# Patient Record
Sex: Female | Born: 2000 | Race: Black or African American | Hispanic: No | Marital: Single | Smoking: Never smoker
Health system: Southern US, Community
[De-identification: ages and names within clinical notes are randomized; demographics above are authoritative.]

---
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
## 2020-02-07 ENCOUNTER — Emergency Department (HOSPITAL_COMMUNITY)
Admission: EM | Admit: 2020-02-07 | Discharge: 2020-02-07 | Disposition: A | Discharge status: Home / Self Care | Payer: 59 | Attending: Emergency Medicine | Admitting: Emergency Medicine

## 2020-02-07 ENCOUNTER — Encounter (HOSPITAL_COMMUNITY): Payer: Self-pay

## 2020-02-07 ENCOUNTER — Other Ambulatory Visit: Payer: Self-pay

## 2020-02-07 DIAGNOSIS — Z711 Person with feared health complaint in whom no diagnosis is made: Secondary | ICD-10-CM | POA: Insufficient documentation

## 2020-02-07 DIAGNOSIS — Z20822 Contact with and (suspected) exposure to covid-19: Secondary | ICD-10-CM | POA: Insufficient documentation

## 2020-02-07 DIAGNOSIS — R509 Fever, unspecified: Secondary | ICD-10-CM | POA: Diagnosis present

## 2020-02-07 DIAGNOSIS — Z79899 Other long term (current) drug therapy: Secondary | ICD-10-CM | POA: Diagnosis not present

## 2020-02-07 LAB — SARS CORONAVIRUS 2 BY RT PCR (HOSPITAL ORDER, PERFORMED IN ~~LOC~~ HOSPITAL LAB): SARS Coronavirus 2: NEGATIVE

## 2020-02-07 NOTE — ED Provider Notes (Signed)
[facility] EMERGENCY DEPARTMENT Provider Note   CSN: [id number] Arrival date & time: 02/07/20  1033     History No chief complaint on file.   Adrienne Johnson is a 19 y.o. female.  The history is provided by the patient. No language interpreter was used.     This is a generally healthy 19 year old female presenting with complaints of fever.  Patient report she went to a job interview this morning.  States she had her temperature checked when entering the building and it was documented her temperature of 101.  Her father decided to have patient come to the ER for evaluation of her fever.  Patient states she is overall feeling well.  She denies feeling fever chills no loss of taste or smell no runny nose sneezing or coughing no congestion, no urinary symptoms and otherwise no other complaints.  She has not been vaccinated for COVID-19.  No specific treatment tried.  She denies any recent sick contact.  History reviewed. No pertinent past medical history.  There are no problems to display for this patient.   History reviewed. No pertinent surgical history.   OB History   No obstetric history on file.     No family history on file.  Social History   Tobacco Use  . Smoking status: Never Smoker  . Smokeless tobacco: Never Used  Substance Use Topics  . Alcohol use: No  . Drug use: Not on file    Home Medications Prior to Admission medications   Medication Sig Start Date End Date Taking? Authorizing Provider  amoxicillin (AMOXIL) 500 MG capsule Take 2 capsules (1,000 mg total) by mouth 2 (two) times daily. 04/30/14   Phillis Haggis, MD  diphenhydrAMINE (BENADRYL) 12.5 MG/5ML liquid Take 25 mg by mouth 4 (four) times daily as needed (rash).     [provider]  EPINEPHrine (EPIPEN JR) 0.15 MG/0.3ML injection Inject 0.15 mg into the muscle as needed. For shellfish allergy Has never had to use    [provider]  famotidine (PEPCID) 20 MG  tablet Take 1 tablet (20 mg total) by mouth 2 (two) times daily. For 2 weeks 01/15/17   Ree Shay, MD  hydrocortisone cream 1 % Apply to affected area 2 times daily 04/30/14   Mabe, Latanya Maudlin, MD  ibuprofen (ADVIL,MOTRIN) 400 MG tablet Take 1 tab PO Q6h x 1-2 days then Q6h prn 01/03/14   Lowanda Foster, NP    Allergies    Shellfish allergy  Review of Systems   Review of Systems  All other systems reviewed and are negative.   Physical Exam Updated Vital Signs BP 117/63 (BP Location: Left Arm)   Pulse 81   Temp 98.8 F (37.1 C) (Oral)   Resp 14   Ht 5\' 7"  (1.702 m)   Wt 53.5 kg   SpO2 97%   BMI 18.48 kg/m   Physical Exam Vitals and nursing note reviewed.  Constitutional:      General: She is not in acute distress.    Appearance: She is well-developed.  HENT:     Head: Atraumatic.  Eyes:     Conjunctiva/sclera: Conjunctivae normal.  Cardiovascular:     Rate and Rhythm: Normal rate and regular rhythm.     Pulses: Normal pulses.     Heart sounds: Normal heart sounds.  Pulmonary:     Effort: Pulmonary effort is normal.     Breath sounds: Normal breath sounds. No wheezing, rhonchi or rales.  Abdominal:  Palpations: Abdomen is soft.     Tenderness: There is no abdominal tenderness.  Musculoskeletal:     Cervical back: Neck supple.  Skin:    Findings: No rash.  Neurological:     Mental Status: She is alert.  Psychiatric:        Mood and Affect: Mood normal.     ED Results / Procedures / Treatments   Labs (all labs ordered are listed, but only abnormal results are displayed) Labs Reviewed - No data to display  EKG None  Radiology No results found.  Procedures Procedures (including critical care time)  Medications Ordered in ED Medications - No data to display  ED Course  I have reviewed the triage vital signs and the nursing notes.  Pertinent labs & imaging results that were available during my care of the patient were reviewed by me and considered in  my medical decision making (see chart for details).    MDM Rules/Calculators/A&P                          BP 117/63 (BP Location: Left Arm)   Pulse 81   Temp 98.8 F (37.1 C) (Oral)   Resp 14   Ht 5\' 7"  (1.702 m)   Wt 53.5 kg   SpO2 97%   BMI 18.48 kg/m   Final Clinical Impression(s) / ED Diagnoses Final diagnoses:  Worried well    Rx / DC Orders ED Discharge Orders    None     1:31 PM Patient here to be evaluated for fever of 101 that was documented at complaints of she went to have a drop in his urine earlier this morning.  She denies having any active symptoms and denies having fever.  Recurrent temperature yes within normal range.  Out of abundance of precaution, a COVID-19 screening test was obtained.  It has not resulted yet.  Patient stable for discharge and can follow-up on the result through MyChart.  Return precaution discussed.  Yamile Roedl was evaluated in Emergency Department on 02/07/2020 for the symptoms described in the history of present illness. She was evaluated in the context of the global COVID-19 pandemic, which necessitated consideration that the patient might be at risk for infection with the SARS-CoV-2 virus that causes COVID-19. Institutional protocols and algorithms that pertain to the evaluation of patients at risk for COVID-19 are in a state of rapid change based on information released by regulatory bodies including the CDC and federal and state organizations. These policies and algorithms were followed during the patient's care in the ED.    02/09/2020, PA-C 02/07/20 1333    Little, 02/09/20, MD 02/07/20 [phone]

## 2020-02-07 NOTE — ED Triage Notes (Signed)
Patient reports that she had fever this am, on arrival no complaints and no fever.

## 2020-02-07 NOTE — Discharge Instructions (Signed)
You here for evaluation of suspected fever.  Temperature is within normal limits at 98.8.  A COVID-19 test have been obtained.  You may be able to check on through MyChart within the next 4 hours.  Return if you have any concern.

## 2020-06-29 ENCOUNTER — Emergency Department (HOSPITAL_COMMUNITY)
Admission: EM | Admit: 2020-06-29 | Discharge: 2020-06-29 | Disposition: A | Discharge status: Home / Self Care | Payer: 59 | Attending: Emergency Medicine | Admitting: Emergency Medicine

## 2020-06-29 ENCOUNTER — Encounter (HOSPITAL_COMMUNITY): Payer: Self-pay | Admitting: Emergency Medicine

## 2020-06-29 ENCOUNTER — Other Ambulatory Visit: Payer: Self-pay

## 2020-06-29 DIAGNOSIS — U071 COVID-19: Secondary | ICD-10-CM | POA: Diagnosis not present

## 2020-06-29 DIAGNOSIS — R062 Wheezing: Secondary | ICD-10-CM

## 2020-06-29 DIAGNOSIS — Z1152 Encounter for screening for COVID-19: Secondary | ICD-10-CM | POA: Diagnosis present

## 2020-06-29 MED ORDER — BENZONATATE 100 MG PO CAPS: 100.0000 mg | ORAL_CAPSULE | Freq: Three times a day (TID) | ORAL | 0 refills | Status: DC

## 2020-06-29 MED ORDER — ALBUTEROL SULFATE HFA 108 (90 BASE) MCG/ACT IN AERS: 2.0000 | INHALATION_SPRAY | Freq: Four times a day (QID) | RESPIRATORY_TRACT | 0 refills | Status: DC | PRN

## 2020-06-29 NOTE — ED Triage Notes (Signed)
Pt arrives to ED with c/o of COVID symptoms. Tested positive at home x4 days ago. States she felt SOB last night and unsure if she can go back to work tomorrow.

## 2020-06-29 NOTE — Discharge Instructions (Signed)
Take medications as prescribed.  Return for new or worsening symptoms. 

## 2020-06-29 NOTE — ED Notes (Signed)
Pt discharged ambulatory. All questions and concerns addressed. No complaints at this time.   

## 2020-06-29 NOTE — ED Provider Notes (Signed)
[facility] EMERGENCY DEPARTMENT Provider Note   CSN: [id number] Arrival date & time: 06/29/20  1428     History Chief Complaint  Patient presents with  . Covid Positive    Adrienne Johnson is a 20 y.o. female with recent past medical history who presents for evaluation of COVID positive status.  Tested positive on Saturday at home test.  She is not vaccinated.  Had sick coworkers.  Patient states she has had cough and wheezing as well as myalgias.  She has taken Tylenol which is helped with her myalgias.  No headache, lightheadedness, dizziness.  Had fever 2 days ago however this resolved.  No history of asthma however she was concerned about her wheeze.  She has no chest pain, lateral leg swelling, redness or warmth.  No hemoptysis chest pain, abdominal pain, diarrhea, dysuria, unilateral leg swelling, redness or warmth.  No prior history of PE, DVT.  No exogenous hormone use, recent surgery immobilization.  Denies additional rating or alleviating factors  History obtained from patient and past medical records.  No interpreter used.  HPI     History reviewed. No pertinent past medical history.  There are no problems to display for this patient.   History reviewed. No pertinent surgical history.   OB History   No obstetric history on file.     History reviewed. No pertinent family history.  Social History   Tobacco Use  . Smoking status: Never Smoker  . Smokeless tobacco: Never Used  Substance Use Topics  . Alcohol use: No    Home Medications Prior to Admission medications   Medication Sig Start Date End Date Taking? Authorizing Provider  albuterol (VENTOLIN HFA) 108 (90 Base) MCG/ACT inhaler Inhale 2 puffs into the lungs every 6 (six) hours as needed for wheezing or shortness of breath. 06/29/20  Yes Rachard Isidro A, PA-C  benzonatate (TESSALON) 100 MG capsule Take 1 capsule (100 mg total) by mouth every 8 (eight) hours. 06/29/20  Yes Rhea Thrun,  Johnnie Goynes A, PA-C  amoxicillin (AMOXIL) 500 MG capsule Take 2 capsules (1,000 mg total) by mouth 2 (two) times daily. 04/30/14   Phillis Haggis, MD  diphenhydrAMINE (BENADRYL) 12.5 MG/5ML liquid Take 25 mg by mouth 4 (four) times daily as needed (rash).     [provider]  EPINEPHrine (EPIPEN JR) 0.15 MG/0.3ML injection Inject 0.15 mg into the muscle as needed. For shellfish allergy Has never had to use    [provider]  famotidine (PEPCID) 20 MG tablet Take 1 tablet (20 mg total) by mouth 2 (two) times daily. For 2 weeks 01/15/17   Ree Shay, MD  hydrocortisone cream 1 % Apply to affected area 2 times daily 04/30/14   Mabe, Latanya Maudlin, MD  ibuprofen (ADVIL,MOTRIN) 400 MG tablet Take 1 tab PO Q6h x 1-2 days then Q6h prn 01/03/14   Lowanda Foster, NP    Allergies    Shellfish allergy  Review of Systems   Review of Systems  Constitutional: Positive for activity change, appetite change, fatigue and fever.  HENT: Positive for congestion and rhinorrhea.   Respiratory: Positive for cough and wheezing.   Cardiovascular: Negative.   Gastrointestinal: Negative.   Genitourinary: Negative.   Musculoskeletal: Positive for myalgias.  Skin: Negative.   Neurological: Negative.   All other systems reviewed and are negative.   Physical Exam Updated Vital Signs BP 110/68 (BP Location: Left Arm)   Pulse 85   Temp 98.8 F (37.1 C) (Oral)   Resp  16   SpO2 99%   Physical Exam Vitals and nursing note reviewed.  Constitutional:      General: She is not in acute distress.    Appearance: She is not ill-appearing, toxic-appearing or diaphoretic.  HENT:     Head: Normocephalic and atraumatic.     Jaw: There is normal jaw occlusion.     Right Ear: Tympanic membrane, ear canal and external ear normal. There is no impacted cerumen. No hemotympanum. Tympanic membrane is not injected, scarred, perforated, erythematous, retracted or bulging.     Left Ear: Tympanic membrane, ear canal and  external ear normal. There is no impacted cerumen. No hemotympanum. Tympanic membrane is not injected, scarred, perforated, erythematous, retracted or bulging.     Ears:     Comments: No Mastoid tenderness.    Nose:     Comments: Clear rhinorrhea and congestion to bilateral nares.  No sinus tenderness.    Mouth/Throat:     Comments: Posterior oropharynx clear.  Mucous membranes moist.  Tonsils without erythema or exudate.  Uvula midline without deviation.  No evidence of PTA or RPA.  No drooling, dysphasia or trismus.  Phonation normal. Neck:     Trachea: Trachea and phonation normal.     Meningeal: Brudzinski's sign and Kernig's sign absent.     Comments: No Neck stiffness or neck rigidity.  No meningismus.  No cervical lymphadenopathy. Cardiovascular:     Comments: No murmurs rubs or gallops. Pulmonary:     Comments: Mild expiratory wheeze.  No accessory muscle usage.  Able speak in full sentences. Abdominal:     Comments: Soft, nontender without rebound or guarding.  No CVA tenderness.  Musculoskeletal:     Comments: Moves all 4 extremities without difficulty.  Lower extremities without edema, erythema or warmth.  Skin:    Comments: Brisk capillary refill.  No rashes or lesions.  Neurological:     Mental Status: She is alert.     Comments: Ambulatory in department without difficulty.  Cranial nerves II through XII grossly intact.  No facial droop.  No aphasia.     ED Results / Procedures / Treatments   Labs (all labs ordered are listed, but only abnormal results are displayed) Labs Reviewed - No data to display  EKG None  Radiology No results found.  Procedures Procedures (including critical care time)  Medications Ordered in ED Medications - No data to display  ED Course  I have reviewed the triage vital signs and the nursing notes.  Pertinent labs & imaging results that were available during my care of the patient were reviewed by me and considered in my medical  decision making (see chart for details).  20 year old, otherwise healthy presents for evaluation of wheeze.  Diagnosed with COVID 4 days PTA.  He is afebrile, nonseptic, not ill-appearing.  She has no respiratory distress.  No tachycardia, tachypnea or hypoxia.  Does have some mild expiratory wheeze.  No unilateral leg swelling, redness or warmth.  Appears otherwise well.  Will DC home with Tessalon Perles and albuterol.  Low suspicion for acute bacterial infection, pneumothorax, PE, dissection, ACS.  She will return for any worsening symptoms  The patient has been appropriately medically screened and/or stabilized in the ED. I have low suspicion for any other emergent medical condition which would require further screening, evaluation or treatment in the ED or require inpatient management.  Patient is hemodynamically stable and in no acute distress.  Patient able to ambulate in department prior to ED.  Evaluation does not show acute pathology that would require ongoing or additional emergent interventions while in the emergency department or further inpatient treatment.  I have discussed the diagnosis with the patient and answered all questions.  Pain is been managed while in the emergency department and patient has no further complaints prior to discharge.  Patient is comfortable with plan discussed in room and is stable for discharge at this time.  I have discussed strict return precautions for returning to the emergency department.  Patient was encouraged to follow-up with PCP/specialist refer to at discharge.    MDM Rules/Calculators/A&P                          Perri Aragones was evaluated in Emergency Department on 06/29/2020 for the symptoms described in the history of present illness. She was evaluated in the context of the global COVID-19 pandemic, which necessitated consideration that the patient might be at risk for infection with the SARS-CoV-2 virus that causes COVID-19. Institutional  protocols and algorithms that pertain to the evaluation of patients at risk for COVID-19 are in a state of rapid change based on information released by regulatory bodies including the CDC and federal and state organizations. These policies and algorithms were followed during the patient's care in the ED. Final Clinical Impression(s) / ED Diagnoses Final diagnoses:  COVID  Wheeze    Rx / DC Orders ED Discharge Orders         Ordered    benzonatate (TESSALON) 100 MG capsule  Every 8 hours        06/29/20 1936    albuterol (VENTOLIN HFA) 108 (90 Base) MCG/ACT inhaler  Every 6 hours PRN        06/29/20 1936           Tyreese Thain A, PA-C 06/29/20 1936    Arby Barrette, MD 07/02/20 [phone]

## 2020-08-30 ENCOUNTER — Encounter (HOSPITAL_COMMUNITY): Payer: Self-pay

## 2020-08-30 ENCOUNTER — Other Ambulatory Visit: Payer: Self-pay

## 2020-08-30 ENCOUNTER — Ambulatory Visit (HOSPITAL_COMMUNITY)
Admission: EM | Admit: 2020-08-30 | Discharge: 2020-08-30 | Disposition: A | Discharge status: Home / Self Care | Payer: 59 | Attending: Medical Oncology | Admitting: Medical Oncology

## 2020-08-30 DIAGNOSIS — R110 Nausea: Secondary | ICD-10-CM | POA: Diagnosis not present

## 2020-08-30 MED ORDER — ONDANSETRON HCL 4 MG PO TABS: 4.0000 mg | ORAL_TABLET | Freq: Four times a day (QID) | ORAL | 0 refills | Status: DC

## 2020-08-30 NOTE — ED Triage Notes (Signed)
Pt in with c/o nausea and loss of appetite that has been going on for 2 days now  Pt also c/o feeling dizzy

## 2020-08-30 NOTE — ED Provider Notes (Signed)
[facility]    CSN: [id number] Arrival date & time: 08/30/20  1111      History   Chief Complaint Chief Complaint  Patient presents with  . Nausea  . loss of appetite    HPI Adrienne Johnson is a 20 y.o. female.   HPI   Nausea: Patient states that for the past 2 days she has had nausea and loss of appetite.  She does state that yesterday morning she woke up and felt a bit dizzy but this has resolved.  She does not believe that head movements cause the dizziness.  She denies any vomiting, diarrhea, constipation.  She has tried eating a brat diet which is helped a bit with symptoms.  No known sick contacts.  No fevers, dysuria, urinary frequency.  Patient reports that she is a virgin and her last menstrual cycle was about 1 month ago.  History reviewed. No pertinent past medical history.  There are no problems to display for this patient.   History reviewed. No pertinent surgical history.  OB History   No obstetric history on file.      Home Medications    Prior to Admission medications   Medication Sig Start Date End Date Taking? Authorizing Provider  albuterol (VENTOLIN HFA) 108 (90 Base) MCG/ACT inhaler Inhale 2 puffs into the lungs every 6 (six) hours as needed for wheezing or shortness of breath. 06/29/20   Henderly, Britni A, PA-C  amoxicillin (AMOXIL) 500 MG capsule Take 2 capsules (1,000 mg total) by mouth 2 (two) times daily. 04/30/14   Mabe, Latanya Maudlin, MD  benzonatate (TESSALON) 100 MG capsule Take 1 capsule (100 mg total) by mouth every 8 (eight) hours. 06/29/20   Henderly, Britni A, PA-C  diphenhydrAMINE (BENADRYL) 12.5 MG/5ML liquid Take 25 mg by mouth 4 (four) times daily as needed (rash).     [provider]  EPINEPHrine (EPIPEN JR) 0.15 MG/0.3ML injection Inject 0.15 mg into the muscle as needed. For shellfish allergy Has never had to use    [provider]  famotidine (PEPCID) 20 MG tablet Take 1 tablet (20 mg total) by mouth 2  (two) times daily. For 2 weeks 01/15/17   Ree Shay, MD  hydrocortisone cream 1 % Apply to affected area 2 times daily 04/30/14   Phillis Haggis, MD  ibuprofen (ADVIL,MOTRIN) 400 MG tablet Take 1 tab PO Q6h x 1-2 days then Q6h prn 01/03/14   Lowanda Foster, NP    Family History History reviewed. No pertinent family history.  Social History Social History   Tobacco Use  . Smoking status: Never Smoker  . Smokeless tobacco: Never Used  Substance Use Topics  . Alcohol use: No     Allergies   Shellfish allergy   Review of Systems Review of Systems  As stated above in HPI Physical Exam Triage Vital Signs ED Triage Vitals  Enc Vitals Group     BP 08/30/20 1149 135/81     Pulse Rate 08/30/20 1149 81     Resp 08/30/20 1149 18     Temp 08/30/20 1149 98.9 F (37.2 C)     Temp src --      SpO2 --      Weight --      Height --      Head Circumference --      Peak Flow --      Pain Score 08/30/20 1148 0     Pain Loc --  Pain Edu? --      Excl. in GC? --    No data found.  Updated Vital Signs BP 135/81   Pulse 81   Temp 98.9 F (37.2 C)   Resp 18   LMP 07/28/2020 (Approximate)    Physical Exam Vitals and nursing note reviewed.  Constitutional:      General: She is not in acute distress.    Appearance: Normal appearance. She is not ill-appearing, toxic-appearing or diaphoretic.  HENT:     Head: Normocephalic.  Eyes:     Extraocular Movements: Extraocular movements intact.     Pupils: Pupils are equal, round, and reactive to light.  Cardiovascular:     Rate and Rhythm: Normal rate and regular rhythm.     Heart sounds: Normal heart sounds.  Pulmonary:     Effort: Pulmonary effort is normal.     Breath sounds: Normal breath sounds.  Abdominal:     General: Abdomen is flat. Bowel sounds are normal. There is no distension.     Palpations: Abdomen is soft. There is no mass.     Tenderness: There is no abdominal tenderness. There is no right CVA tenderness, left  CVA tenderness, guarding or rebound.     Hernia: No hernia is present.  Musculoskeletal:     Cervical back: Normal range of motion.  Lymphadenopathy:     Cervical: No cervical adenopathy.  Skin:    General: Skin is warm.  Neurological:     Mental Status: She is alert.      UC Treatments / Results  Labs (all labs ordered are listed, but only abnormal results are displayed) Labs Reviewed - No data to display  EKG   Radiology No results found.  Procedures Procedures (including critical care time)  Medications Ordered in UC Medications - No data to display  Initial Impression / Assessment and Plan / UC Course  I have reviewed the triage vital signs and the nursing notes.  Pertinent labs & imaging results that were available during my care of the patient were reviewed by me and considered in my medical decision making (see chart for details).     New.  Likely viral gastroenteritis that we are seeing going around.  Treating with Zofran along with a brat diet.  Discussed red flag signs and symptoms.  Follow-up as needed. Final Clinical Impressions(s) / UC Diagnoses   Final diagnoses:  None   Discharge Instructions   None    ED Prescriptions    None     PDMP not reviewed this encounter.   Rushie Chestnut, [place] 08/30/20 1257

## 2021-05-13 ENCOUNTER — Ambulatory Visit (HOSPITAL_COMMUNITY)
Admission: EM | Admit: 2021-05-13 | Discharge: 2021-05-13 | Disposition: A | Discharge status: Home / Self Care | Payer: 59 | Attending: Internal Medicine | Admitting: Internal Medicine

## 2021-05-13 ENCOUNTER — Other Ambulatory Visit: Payer: Self-pay

## 2021-05-13 DIAGNOSIS — R6889 Other general symptoms and signs: Secondary | ICD-10-CM

## 2021-05-13 DIAGNOSIS — R519 Headache, unspecified: Secondary | ICD-10-CM | POA: Diagnosis not present

## 2021-05-13 DIAGNOSIS — J029 Acute pharyngitis, unspecified: Secondary | ICD-10-CM

## 2021-05-13 DIAGNOSIS — R509 Fever, unspecified: Secondary | ICD-10-CM

## 2021-05-13 DIAGNOSIS — R0981 Nasal congestion: Secondary | ICD-10-CM

## 2021-05-13 LAB — POC INFLUENZA A AND B ANTIGEN (URGENT CARE ONLY)
INFLUENZA A ANTIGEN, POC: NEGATIVE
INFLUENZA B ANTIGEN, POC: NEGATIVE

## 2021-05-13 MED ORDER — BENZONATATE 100 MG PO CAPS: 100.0000 mg | ORAL_CAPSULE | Freq: Three times a day (TID) | ORAL | 0 refills | Status: DC

## 2021-05-13 MED ORDER — IBUPROFEN 600 MG PO TABS: 600.0000 mg | ORAL_TABLET | Freq: Four times a day (QID) | ORAL | 0 refills | Status: DC | PRN

## 2021-05-13 NOTE — Discharge Instructions (Signed)
Increase oral fluid intake Take medications as prescribed We will call you with recommendations if labs are abnormal Return to urgent care if symptoms worsen. 

## 2021-05-13 NOTE — ED Triage Notes (Signed)
Patient presents to the office for body aches,fever and coughing x 3 days

## 2021-05-15 ENCOUNTER — Ambulatory Visit (HOSPITAL_COMMUNITY)
Admission: EM | Admit: 2021-05-15 | Discharge: 2021-05-15 | Disposition: A | Discharge status: Home / Self Care | Payer: 59 | Attending: Emergency Medicine | Admitting: Emergency Medicine

## 2021-05-15 ENCOUNTER — Encounter (HOSPITAL_COMMUNITY): Payer: Self-pay | Admitting: Emergency Medicine

## 2021-05-15 ENCOUNTER — Other Ambulatory Visit: Payer: Self-pay

## 2021-05-15 DIAGNOSIS — U071 COVID-19: Secondary | ICD-10-CM | POA: Diagnosis not present

## 2021-05-15 MED ORDER — ALBUTEROL SULFATE HFA 108 (90 BASE) MCG/ACT IN AERS: 2.0000 | INHALATION_SPRAY | Freq: Four times a day (QID) | RESPIRATORY_TRACT | 0 refills | Status: DC | PRN

## 2021-05-15 NOTE — ED Provider Notes (Signed)
[facility]    CSN: [id number] Arrival date & time: 05/15/21  0805      History   Chief Complaint Chief Complaint  Patient presents with   Cough    Chest congestion     HPI Adrienne Johnson is a 20 y.o. female.  Patient reports feeling ill for the last 4 days.  Tested positive for COVID at home this morning.  Symptoms include fever, nasal congestion, sore throat, cough.  Fever has resolved at this point.  Patient reports she is feeling better except last night and this morning she felt a little short of breath.  She does not feel short of breath at this time.  She needs a note for work.  Has been managing symptoms at home with TheraFlu   Cough Associated symptoms: chills, fever, rhinorrhea and sore throat    History reviewed. No pertinent past medical history.  There are no problems to display for this patient.   History reviewed. No pertinent surgical history.  OB History   No obstetric history on file.      Home Medications    Prior to Admission medications   Medication Sig Start Date End Date Taking? Authorizing Provider  albuterol (VENTOLIN HFA) 108 (90 Base) MCG/ACT inhaler Inhale 2 puffs into the lungs every 6 (six) hours as needed for wheezing or shortness of breath. 05/15/21   Cathlyn Parsons, NP  benzonatate (TESSALON) 100 MG capsule Take 1 capsule (100 mg total) by mouth every 8 (eight) hours. 05/13/21   Merrilee Jansky, MD  diphenhydrAMINE (BENADRYL) 12.5 MG/5ML liquid Take 25 mg by mouth 4 (four) times daily as needed (rash).     [provider]  EPINEPHrine (EPIPEN JR) 0.15 MG/0.3ML injection Inject 0.15 mg into the muscle as needed. For shellfish allergy Has never had to use    [provider]  famotidine (PEPCID) 20 MG tablet Take 1 tablet (20 mg total) by mouth 2 (two) times daily. For 2 weeks 01/15/17   Ree Shay, MD  hydrocortisone cream 1 % Apply to affected area 2 times daily 04/30/14   Mabe, Latanya Maudlin, MD  ibuprofen  (ADVIL) 600 MG tablet Take 1 tablet (600 mg total) by mouth every 6 (six) hours as needed. 05/13/21   Lamptey, Britta Mccreedy, MD  ondansetron (ZOFRAN) 4 MG tablet Take 1 tablet (4 mg total) by mouth every 6 (six) hours. 08/30/20   Rushie Chestnut, PA-C    Family History History reviewed. No pertinent family history.  Social History Social History   Tobacco Use   Smoking status: Never   Smokeless tobacco: Never  Substance Use Topics   Alcohol use: No     Allergies   Shellfish allergy   Review of Systems Review of Systems  Constitutional:  Positive for chills and fever.  HENT:  Positive for congestion, postnasal drip, rhinorrhea and sore throat.   Respiratory:  Positive for cough.     Physical Exam Triage Vital Signs ED Triage Vitals  Enc Vitals Group     BP 05/15/21 0826 127/76     Pulse Rate 05/15/21 0826 100     Resp 05/15/21 0826 17     Temp 05/15/21 0826 99 F (37.2 C)     Temp Source 05/15/21 0826 Oral     SpO2 05/15/21 0826 99 %     Weight --      Height --      Head Circumference --      Peak Flow --  Pain Score 05/15/21 0825 0     Pain Loc --      Pain Edu? --      Excl. in [place]? --    No data found.  Updated Vital Signs BP 127/76 (BP Location: Left Arm)   Pulse 100   Temp 99 F (37.2 C) (Oral)   Resp 17   SpO2 99%   Visual Acuity Right Eye Distance:   Left Eye Distance:   Bilateral Distance:    Right Eye Near:   Left Eye Near:    Bilateral Near:     Physical Exam Constitutional:      General: She is not in acute distress.    Appearance: Normal appearance. She is not ill-appearing.  HENT:     Right Ear: Tympanic membrane, ear canal and external ear normal.     Left Ear: Tympanic membrane, ear canal and external ear normal.     Nose: Congestion and rhinorrhea present.     Mouth/Throat:     Mouth: Mucous membranes are moist.     Pharynx: Oropharynx is clear. No oropharyngeal exudate or posterior oropharyngeal erythema.   Cardiovascular:     Rate and Rhythm: Normal rate and regular rhythm.  Pulmonary:     Effort: Pulmonary effort is normal.     Breath sounds: Normal breath sounds.     Comments: No cough observed during H&P Neurological:     Mental Status: She is alert.     UC Treatments / Results  Labs (all labs ordered are listed, but only abnormal results are displayed) Labs Reviewed - No data to display  EKG   Radiology No results found.  Procedures Procedures (including critical care time)  Medications Ordered in UC Medications - No data to display  Initial Impression / Assessment and Plan / UC Course  I have reviewed the triage vital signs and the nursing notes.  Pertinent labs & imaging results that were available during my care of the patient were reviewed by me and considered in my medical decision making (see chart for details).    Reviewed supportive care measures.  Reviewed reasons for seeking a higher level of care.  Reviewed normal course of COVID.  Patient to continue OTC meds to help manage her symptoms.  Reviewed quarantine procedures.  Given note for work.  Patient's lungs clear at this time and her O2 sats are normal.  Given albuterol inhaler prescription to use in case she feels short of breath again.  Final Clinical Impressions(s) / UC Diagnoses   Final diagnoses:  COVID-19     Discharge Instructions      You need to remain home for 5-10 days depending on symptom improvement and work policy; may return if fever free and symptoms consistently improving for 3 days.     ED Prescriptions     Medication Sig Dispense Auth. Provider   albuterol (VENTOLIN HFA) 108 (90 Base) MCG/ACT inhaler Inhale 2 puffs into the lungs every 6 (six) hours as needed for wheezing or shortness of breath. 8 g Cathlyn Parsons, NP      PDMP not reviewed this encounter.   Cathlyn Parsons, NP 05/15/21 [phone]

## 2021-05-15 NOTE — ED Triage Notes (Signed)
Pt is present today with cough and chest congestion. Pt sx started Friday. Pt states she tested positive for Covid at home yesterday

## 2021-05-15 NOTE — Discharge Instructions (Signed)
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

## 2021-05-19 NOTE — ED Provider Notes (Signed)
[facility]    CSN: [id number] Arrival date & time: 05/13/21  1632      History   Chief Complaint Chief Complaint  Patient presents with   Fever   Generalized Body Aches    Coughing x 3 days    HPI Adrienne Johnson is a 20 y.o. female comes to the urgent care with a 3-day history of generalized body aches, fever and general malaise.  Patient's symptoms started 3 days ago and has been persistent.  It is associated with a cough which is nonproductive.  Patient denies any difficulty breathing and breathing.  No nausea, vomiting or diarrhea.  Patient had a fever of 100.9 Fahrenheit on presentation.  No ear pain or hearing problems.  No sick contacts.  No shortness of breath or wheezing.Marland Kitchen   HPI  No past medical history on file.  There are no problems to display for this patient.   No past surgical history on file.  OB History   No obstetric history on file.      Home Medications    Prior to Admission medications   Medication Sig Start Date End Date Taking? Authorizing Provider  benzonatate (TESSALON) 100 MG capsule Take 1 capsule (100 mg total) by mouth every 8 (eight) hours. 05/13/21  Yes Wakisha Alberts, Britta Mccreedy, MD  ibuprofen (ADVIL) 600 MG tablet Take 1 tablet (600 mg total) by mouth every 6 (six) hours as needed. 05/13/21  Yes Analeia Ismael, Britta Mccreedy, MD  albuterol (VENTOLIN HFA) 108 (90 Base) MCG/ACT inhaler Inhale 2 puffs into the lungs every 6 (six) hours as needed for wheezing or shortness of breath. 05/15/21   Cathlyn Parsons, NP  diphenhydrAMINE (BENADRYL) 12.5 MG/5ML liquid Take 25 mg by mouth 4 (four) times daily as needed (rash).     [provider]  EPINEPHrine (EPIPEN JR) 0.15 MG/0.3ML injection Inject 0.15 mg into the muscle as needed. For shellfish allergy Has never had to use    [provider]  famotidine (PEPCID) 20 MG tablet Take 1 tablet (20 mg total) by mouth 2 (two) times daily. For 2 weeks 01/15/17   Ree Shay, MD  hydrocortisone  cream 1 % Apply to affected area 2 times daily 04/30/14   Mabe, Latanya Maudlin, MD  ondansetron (ZOFRAN) 4 MG tablet Take 1 tablet (4 mg total) by mouth every 6 (six) hours. 08/30/20   Rushie Chestnut, PA-C    Family History No family history on file.  Social History Social History   Tobacco Use   Smoking status: Never   Smokeless tobacco: Never  Substance Use Topics   Alcohol use: No     Allergies   Shellfish allergy   Review of Systems Review of Systems  Constitutional:  Positive for chills and fever.  HENT:  Positive for congestion and sore throat.   Respiratory:  Negative for cough, chest tightness, shortness of breath and wheezing.   Cardiovascular:  Negative for chest pain.  Neurological:  Positive for headaches.    Physical Exam Triage Vital Signs ED Triage Vitals  Enc Vitals Group     BP 05/13/21 1713 107/76     Pulse Rate 05/13/21 1713 68     Resp 05/13/21 1713 16     Temp 05/13/21 1713 (!) 100.9 F (38.3 C)     Temp Source 05/13/21 1713 Oral     SpO2 05/13/21 1713 100 %     Weight --      Height --  Head Circumference --      Peak Flow --      Pain Score 05/13/21 1715 6     Pain Loc --      Pain Edu? --      Excl. in GC? --    No data found.  Updated Vital Signs BP 107/76 (BP Location: Left Arm)   Pulse 68   Temp (!) 100.9 F (38.3 C) (Oral)   Resp 16   SpO2 100%   Visual Acuity Right Eye Distance:   Left Eye Distance:   Bilateral Distance:    Right Eye Near:   Left Eye Near:    Bilateral Near:     Physical Exam Vitals and nursing note reviewed.  Constitutional:      Appearance: She is ill-appearing.  HENT:     Right Ear: Tympanic membrane normal.     Left Ear: Tympanic membrane normal.  Cardiovascular:     Rate and Rhythm: Normal rate and regular rhythm.  Pulmonary:     Effort: Pulmonary effort is normal. No respiratory distress.     Breath sounds: No wheezing or rhonchi.  Abdominal:     General: Bowel sounds are normal.      Palpations: Abdomen is soft.  Neurological:     General: No focal deficit present.     Mental Status: She is alert and oriented to person, place, and time.     UC Treatments / Results  Labs (all labs ordered are listed, but only abnormal results are displayed) Labs Reviewed  POC INFLUENZA A AND B ANTIGEN (URGENT CARE ONLY)    EKG   Radiology No results found.  Procedures Procedures (including critical care time)  Medications Ordered in UC Medications - No data to display  Initial Impression / Assessment and Plan / UC Course  I have reviewed the triage vital signs and the nursing notes.  Pertinent labs & imaging results that were available during my care of the patient were reviewed by me and considered in my medical decision making (see chart for details).     1.  Flulike symptoms: Point-of-care flu test is negative Tessalon Perles as needed for cough Ibuprofen as needed for pain or fever Increase oral fluid intake Return precautions given.  Final Clinical Impressions(s) / UC Diagnoses   Final diagnoses:  Flu-like symptoms     Discharge Instructions      Increase oral fluid intake Take medications as prescribed We will call you with recommendations if labs are abnormal Return to urgent care if symptoms worsen.   ED Prescriptions     Medication Sig Dispense Auth. Provider   benzonatate (TESSALON) 100 MG capsule Take 1 capsule (100 mg total) by mouth every 8 (eight) hours. 21 capsule Latonia Conrow, Britta Mccreedy, MD   ibuprofen (ADVIL) 600 MG tablet Take 1 tablet (600 mg total) by mouth every 6 (six) hours as needed. 30 tablet Pina Sirianni, Britta Mccreedy, MD      PDMP not reviewed this encounter.   Merrilee Jansky, MD 05/19/21 [phone]

## 2022-03-23 ENCOUNTER — Encounter (HOSPITAL_COMMUNITY): Payer: Self-pay

## 2022-03-23 ENCOUNTER — Ambulatory Visit (HOSPITAL_COMMUNITY)
Admission: EM | Admit: 2022-03-23 | Discharge: 2022-03-23 | Disposition: A | Discharge status: Home / Self Care | Payer: Self-pay | Attending: Nurse Practitioner | Admitting: Nurse Practitioner

## 2022-03-23 DIAGNOSIS — J358 Other chronic diseases of tonsils and adenoids: Secondary | ICD-10-CM

## 2022-03-23 DIAGNOSIS — Z8709 Personal history of other diseases of the respiratory system: Secondary | ICD-10-CM

## 2022-03-23 DIAGNOSIS — J029 Acute pharyngitis, unspecified: Secondary | ICD-10-CM

## 2022-03-23 LAB — POCT RAPID STREP A, ED / UC: Streptococcus, Group A Screen (Direct): NEGATIVE

## 2022-03-23 LAB — POCT INFECTIOUS MONO SCREEN, ED / UC: Mono Screen: NEGATIVE

## 2022-03-23 MED ORDER — AMOXICILLIN 500 MG PO CAPS: 500.0000 mg | ORAL_CAPSULE | Freq: Two times a day (BID) | ORAL | 0 refills | Status: DC

## 2022-03-23 NOTE — ED Triage Notes (Signed)
Patient states she has had a sore throat that started yesterday. States it hurts when she swallows. Denies fever and any other sx.

## 2022-03-23 NOTE — Discharge Instructions (Addendum)
Rapid strep throat test and mononucleosis screen are both negative today.  I am highly suspicious that you have strep throat.  We have sent a strep throat culture and will call you on Monday if this is positive.    In the meantime, please start the amoxicillin and take it as prescribed to treat strep throat.    Start using salt water gargles, Chloraseptic throat spray, and throat lozenges to help with the throat pain.    Make sure you change her toothbrush after starting treatment.    If symptoms worsen or persist despite treatment, seek medical care.

## 2022-03-23 NOTE — ED Provider Notes (Signed)
[facility]    CSN: [id number] Arrival date & time: 03/23/22  1001      History   Chief Complaint Chief Complaint  Patient presents with   Sore Throat    HPI Adrienne Johnson is a 21 y.o. female.   Presents for 1 day of sore throat.  She denies fever, cough, congestion, chest pain or shortness of breath, postnasal drainage, sneezing, swollen glands, headache, abdominal pain, nausea/vomiting, or diarrhea.  No new rash.  She has been little bit more tired than normal.  Reports her appetite has been decreased and she is not been wanting to eat because it hurts to swallow.  Denies known sick contacts or exposures.  Does report of history of strep throat.  Last documented throat culture was approximately 8 years ago.    History reviewed. No pertinent past medical history.  There are no problems to display for this patient.   History reviewed. No pertinent surgical history.  OB History   No obstetric history on file.      Home Medications    Prior to Admission medications   Medication Sig Start Date End Date Taking? Authorizing Provider  amoxicillin (AMOXIL) 500 MG capsule Take 1 capsule (500 mg total) by mouth 2 (two) times daily for 10 days. 03/23/22 04/02/22 Yes Eulogio Bear, NP  EPINEPHrine (EPIPEN JR) 0.15 MG/0.3ML injection Inject 0.15 mg into the muscle as needed. For shellfish allergy Has never had to use    [provider]    Family History No family history on file.  Social History Social History   Tobacco Use   Smoking status: Never   Smokeless tobacco: Never  Substance Use Topics   Alcohol use: No     Allergies   Shellfish allergy   Review of Systems Review of Systems Per HPI  Physical Exam Triage Vital Signs ED Triage Vitals  Enc Vitals Group     BP 03/23/22 1018 118/82     Pulse Rate 03/23/22 1018 84     Resp 03/23/22 1018 16     Temp 03/23/22 1018 98.7 F (37.1 C)     Temp Source 03/23/22 1018 Oral     SpO2  03/23/22 1018 100 %     Weight --      Height --      Head Circumference --      Peak Flow --      Pain Score 03/23/22 1016 8     Pain Loc --      Pain Edu? --      Excl. in [place]? --    No data found.  Updated Vital Signs BP 118/82 (BP Location: Right Arm)   Pulse 84   Temp 98.7 F (37.1 C) (Oral)   Resp 16   LMP 02/25/2022   SpO2 100%   Visual Acuity Right Eye Distance:   Left Eye Distance:   Bilateral Distance:    Right Eye Near:   Left Eye Near:    Bilateral Near:     Physical Exam Vitals and nursing note reviewed.  Constitutional:      General: She is not in acute distress.    Appearance: She is well-developed. She is not toxic-appearing.  HENT:     Head: Normocephalic and atraumatic.     Right Ear: Tympanic membrane and ear canal normal. No drainage, swelling or tenderness. No middle ear effusion. Tympanic membrane is not erythematous.     Left Ear: Tympanic membrane and ear canal  normal. No drainage, swelling or tenderness.  No middle ear effusion. Tympanic membrane is not erythematous.     Nose: No congestion or rhinorrhea.     Mouth/Throat:     Mouth: Mucous membranes are moist.     Pharynx: Oropharynx is clear. Uvula midline. Posterior oropharyngeal erythema present.     Tonsils: Tonsillar exudate present. 3+ on the right. 3+ on the left.  Eyes:     Extraocular Movements:     Right eye: Normal extraocular motion.     Left eye: Normal extraocular motion.  Cardiovascular:     Rate and Rhythm: Normal rate and regular rhythm.  Pulmonary:     Effort: Pulmonary effort is normal. No respiratory distress.     Breath sounds: Normal breath sounds. No wheezing, rhonchi or rales.  Musculoskeletal:     Cervical back: Normal range of motion and neck supple.  Lymphadenopathy:     Cervical: No cervical adenopathy.  Skin:    General: Skin is warm and dry.     Capillary Refill: Capillary refill takes less than 2 seconds.     Coloration: Skin is not pale.      Findings: No erythema or rash.  Neurological:     Mental Status: She is alert and oriented to person, place, and time.  Psychiatric:        Behavior: Behavior is cooperative.      UC Treatments / Results  Labs (all labs ordered are listed, but only abnormal results are displayed) Labs Reviewed  CULTURE, GROUP A STREP [facility])  POCT RAPID STREP A, ED / UC  POCT INFECTIOUS MONO SCREEN, ED / UC    EKG   Radiology No results found.  Procedures Procedures (including critical care time)  Medications Ordered in UC Medications - No data to display  Initial Impression / Assessment and Plan / UC Course  I have reviewed the triage vital signs and the nursing notes.  Pertinent labs & imaging results that were available during my care of the patient were reviewed by me and considered in my medical decision making (see chart for details).    Patient is well-appearing, normotensive, afebrile, not tachycardic, not tachypneic, oxygenating well on room air.  Rapid strep throat test is negative.  Given tonsillar exudate, erythema, and hypertrophy, mononucleosis screen checked and negative.  Throat culture pending.  Will empirically treat patient with amoxicillin 500 mg twice daily for 10 days.  Symptomatic and supportive care discussed.  ER and return precautions discussed.  The patient was given the opportunity to ask questions.  All questions answered to their satisfaction.  The patient is in agreement to this plan.    Final Clinical Impressions(s) / UC Diagnoses   Final diagnoses:  Acute pharyngitis, unspecified etiology  History of streptococcal sore throat  Tonsillar exudate     Discharge Instructions      Rapid strep throat test and mononucleosis screen are both negative today.  I am highly suspicious that you have strep throat.  We have sent a strep throat culture and will call you on Monday if this is positive.    In the meantime, please start the amoxicillin and take it as  prescribed to treat strep throat.    Start using salt water gargles, Chloraseptic throat spray, and throat lozenges to help with the throat pain.    Make sure you change her toothbrush after starting treatment.    If symptoms worsen or persist despite treatment, seek medical care.  ED Prescriptions     Medication Sig Dispense Auth. Provider   amoxicillin (AMOXIL) 500 MG capsule Take 1 capsule (500 mg total) by mouth 2 (two) times daily for 10 days. 20 capsule Valentino Nose, NP      PDMP not reviewed this encounter.   Valentino Nose, NP 03/23/22 1108

## 2022-03-25 ENCOUNTER — Ambulatory Visit (HOSPITAL_COMMUNITY)
Admission: EM | Admit: 2022-03-25 | Discharge: 2022-03-25 | Disposition: A | Discharge status: Home / Self Care | Payer: Self-pay | Attending: Emergency Medicine | Admitting: Emergency Medicine

## 2022-03-25 ENCOUNTER — Encounter (HOSPITAL_COMMUNITY): Payer: Self-pay | Admitting: Emergency Medicine

## 2022-03-25 DIAGNOSIS — J029 Acute pharyngitis, unspecified: Secondary | ICD-10-CM

## 2022-03-25 LAB — CULTURE, GROUP A STREP (THRC)

## 2022-03-25 NOTE — ED Provider Notes (Signed)
[facility]    CSN: [id number] Arrival date & time: 03/25/22  6387      History   Chief Complaint Chief Complaint  Patient presents with   Sore Throat    HPI Atavia Poppe is a 21 y.o. female.   Patient presents with sore throat, enlarged tonsils and Donyetta Ogletree exudate for 4 days.  Tolerating food and liquids.  Was evaluated in urgent care 2 days ago, rapid strep negative, sent for culture which is negative and monotest negative, was started on amoxicillin prophylactically based on appearance of throat, patient endorses no improvement.  Denies fever, chills, body aches, ear pain, congestion, cough, nausea, vomiting or diarrhea.  Has not attempted additional treatment.  History of reoccurring pharyngitis.  History reviewed. No pertinent past medical history.  There are no problems to display for this patient.   History reviewed. No pertinent surgical history.  OB History   No obstetric history on file.      Home Medications    Prior to Admission medications   Medication Sig Start Date End Date Taking? Authorizing Provider  amoxicillin (AMOXIL) 500 MG capsule Take 1 capsule (500 mg total) by mouth 2 (two) times daily for 10 days. 03/23/22 04/02/22  Valentino Nose, NP  EPINEPHrine (EPIPEN JR) 0.15 MG/0.3ML injection Inject 0.15 mg into the muscle as needed. For shellfish allergy Has never had to use    [provider]    Family History No family history on file.  Social History Social History   Tobacco Use   Smoking status: Never   Smokeless tobacco: Never  Substance Use Topics   Alcohol use: No     Allergies   Shellfish allergy   Review of Systems Review of Systems  Constitutional: Negative.   HENT:  Positive for sore throat. Negative for congestion, dental problem, drooling, ear discharge, ear pain, facial swelling, hearing loss, mouth sores, nosebleeds, postnasal drip, rhinorrhea, sinus pressure, sinus pain, sneezing, tinnitus,  trouble swallowing and voice change.   Respiratory: Negative.    Cardiovascular: Negative.   Gastrointestinal: Negative.   Skin: Negative.   Neurological: Negative.      Physical Exam Triage Vital Signs ED Triage Vitals  Enc Vitals Group     BP 03/25/22 0943 107/70     Pulse Rate 03/25/22 0943 61     Resp 03/25/22 0943 15     Temp 03/25/22 0943 98.9 F (37.2 C)     Temp Source 03/25/22 0943 Oral     SpO2 03/25/22 0943 100 %     Weight --      Height --      Head Circumference --      Peak Flow --      Pain Score 03/25/22 0942 1     Pain Loc --      Pain Edu? --      Excl. in [place]? --    No data found.  Updated Vital Signs BP 107/70 (BP Location: Left Arm)   Pulse 61   Temp 98.9 F (37.2 C) (Oral)   Resp 15   LMP 03/24/2022   SpO2 100%   Visual Acuity Right Eye Distance:   Left Eye Distance:   Bilateral Distance:    Right Eye Near:   Left Eye Near:    Bilateral Near:     Physical Exam Constitutional:      Appearance: She is well-developed.  HENT:     Head: Normocephalic.     Right Ear: Tympanic  membrane and ear canal normal.     Left Ear: Tympanic membrane and ear canal normal.     Nose: No congestion or rhinorrhea.     Mouth/Throat:     Mouth: Mucous membranes are moist.     Pharynx: Posterior oropharyngeal erythema present.     Tonsils: Tonsillar exudate present. 1+ on the right. 1+ on the left.  Cardiovascular:     Rate and Rhythm: Normal rate and regular rhythm.     Heart sounds: Normal heart sounds.  Pulmonary:     Effort: Pulmonary effort is normal.     Breath sounds: Normal breath sounds.  Musculoskeletal:     Cervical back: Normal range of motion and neck supple.  Skin:    General: Skin is warm and dry.  Neurological:     General: No focal deficit present.     Mental Status: She is alert and oriented to person, place, and time.  Psychiatric:        Mood and Affect: Mood normal.        Behavior: Behavior normal.      UC Treatments /  Results  Labs (all labs ordered are listed, but only abnormal results are displayed) Labs Reviewed - No data to display  EKG   Radiology No results found.  Procedures Procedures (including critical care time)  Medications Ordered in UC Medications - No data to display  Initial Impression / Assessment and Plan / UC Course  I have reviewed the triage vital signs and the nursing notes.  Pertinent labs & imaging results that were available during my care of the patient were reviewed by me and considered in my medical decision making (see chart for details).  Viral pharyngitis  Vital signs are stable patient is in no signs of distress nor toxic appearing, mild erythema is noted to the oropharynx with tonsillar adenopathy and exudate, as testing was completed 2 days ago will not repeat, requesting STI panel, pending, will treat per protocol, advised abstinence until lab results, etiology is most likely viral as amoxicillin is not improving symptoms, discussed, recommended supportive measures with follow-up with urgent care as needed Final Clinical Impressions(s) / UC Diagnoses   Final diagnoses:  None   Discharge Instructions   None    ED Prescriptions   None    PDMP not reviewed this encounter.   Hans Eden, NP 03/25/22 1003

## 2022-03-25 NOTE — ED Triage Notes (Signed)
Pt here 2 days ago for sore throat, reports strep was negative. Pt concerned that could be STI since strep was negative. Pain started on Friday. Taking Ibuprofen for pain. Reports still red and swollen. Pt was given amoxicillin for it but not helping.

## 2022-03-25 NOTE — Discharge Instructions (Signed)
Today you are being diagnosed with a sore throat caused by viruses, as you have been taking amoxicillin with no improvement this is most likely because there is no bacteria present and your symptoms are caused by a virus meaning it must resolve with time  Per your request we have tested your throat for gonorrhea, chlamydia and trichomoniasis, testing will return in about 2 to 3 days, you will be notified of positive test results only and treatment is intended at that time, you will need to return to clinic if positive for gonorrhea, please refrain from having any form of sexual intercourse until your lab results have returned  You may continue to take the antibiotic if you would like to do so as this will keep any bacteria from entering your system and worsening your symptoms  You may take Tylenol and ibuprofen every 6 hours as needed for any discomfort  You may gargle salt water, gargle Listerine, eat softer foods, drink warm to cold liquids to preference and attempt use of over-the-counter Chloraseptic spray for additional support  You may follow-up with urgent care as needed

## 2022-03-26 LAB — CYTOLOGY, (ORAL, ANAL, URETHRAL) ANCILLARY ONLY
Chlamydia: NEGATIVE
Comment: NEGATIVE
Comment: NEGATIVE
Comment: NORMAL
Neisseria Gonorrhea: NEGATIVE
Trichomonas: NEGATIVE

## 2022-03-28 ENCOUNTER — Ambulatory Visit (HOSPITAL_COMMUNITY)
Admission: RE | Admit: 2022-03-28 | Discharge: 2022-03-28 | Disposition: A | Discharge status: Home / Self Care | Payer: Self-pay | Source: Ambulatory Visit | Attending: Family Medicine | Admitting: Family Medicine

## 2022-03-28 ENCOUNTER — Encounter (HOSPITAL_COMMUNITY): Payer: Self-pay

## 2022-03-28 ENCOUNTER — Ambulatory Visit: Payer: Self-pay

## 2022-03-28 VITALS — BP 121/72 | HR 72 | Temp 98.8°F | Resp 16

## 2022-03-28 DIAGNOSIS — J039 Acute tonsillitis, unspecified: Secondary | ICD-10-CM

## 2022-03-28 MED ORDER — AMOXICILLIN-POT CLAVULANATE 875-125 MG PO TABS: 1.0000 | ORAL_TABLET | Freq: Two times a day (BID) | ORAL | 0 refills | Status: AC

## 2022-03-28 NOTE — ED Triage Notes (Signed)
Pt was seen here 2-3 days ago for throat pain and swelling. Pt is concern about oral STI.

## 2022-03-28 NOTE — Discharge Instructions (Signed)
You were seen today for sore throat.  I am treating you for tonsillitis today.  Please take augmentin twice/day x 10 days with food to avoid upset stomach.  Please get a new toothbrush in 2 days, and launder your pillowcases and bed sheets.  You can continue salt water gargles and motrin for pain and swelling.  If not improving then you will need to see ENT.  Please call [phone] for an appointment.

## 2022-03-28 NOTE — ED Provider Notes (Signed)
[facility]    CSN: [id number] Arrival date & time: 03/28/22  0920      History   Chief Complaint Chief Complaint  Patient presents with   Oral Swelling    Throat swollen and have white spots in the back of my throat swelling hasn't went down - Entered by patient    HPI Adrienne Johnson is a 21 y.o. female.   She is here for sore throat x 1 week.  Was seen on 10/7, strep and mono were negative.  Given amoxil initially, but then that was stopped with negative culture.  Was seen on 10/9, STI panel done, all negative.  Recommend otc medication only.  She continues to have pain in the back of her throat.  At times is hard to swallow.  She thinks it is still swollen.   No fevers/chills.  No runny nose, congestion or drainage.  She did change her toothbrush and clean bedding.    History reviewed. No pertinent past medical history.  There are no problems to display for this patient.   History reviewed. No pertinent surgical history.  OB History   No obstetric history on file.      Home Medications    Prior to Admission medications   Medication Sig Start Date End Date Taking? Authorizing Provider  amoxicillin (AMOXIL) 500 MG capsule Take 1 capsule (500 mg total) by mouth 2 (two) times daily for 10 days. 03/23/22 04/02/22  Valentino Nose, NP  EPINEPHrine (EPIPEN JR) 0.15 MG/0.3ML injection Inject 0.15 mg into the muscle as needed. For shellfish allergy Has never had to use    [provider]    Family History History reviewed. No pertinent family history.  Social History Social History   Tobacco Use   Smoking status: Never   Smokeless tobacco: Never  Substance Use Topics   Alcohol use: No     Allergies   Shellfish allergy   Review of Systems Review of Systems  Constitutional: Negative.   HENT:  Positive for sore throat, trouble swallowing and voice change. Negative for congestion and rhinorrhea.   Respiratory: Negative.     Gastrointestinal: Negative.   Genitourinary: Negative.   Musculoskeletal: Negative.   Psychiatric/Behavioral: Negative.       Physical Exam Triage Vital Signs ED Triage Vitals [03/28/22 0929]  Enc Vitals Group     BP 121/72     Pulse Rate 72     Resp 16     Temp 98.8 F (37.1 C)     Temp Source Oral     SpO2 98 %     Weight      Height      Head Circumference      Peak Flow      Pain Score      Pain Loc      Pain Edu?      Excl. in [place]?    No data found.  Updated Vital Signs BP 121/72 (BP Location: Left Arm)   Pulse 72   Temp 98.8 F (37.1 C) (Oral)   Resp 16   LMP 03/24/2022   SpO2 98%   Visual Acuity Right Eye Distance:   Left Eye Distance:   Bilateral Distance:    Right Eye Near:   Left Eye Near:    Bilateral Near:     Physical Exam Constitutional:      Appearance: Normal appearance.  HENT:     Head: Normocephalic.     Mouth/Throat:  Mouth: Mucous membranes are moist.     Tonsils: Tonsillar exudate present. 2+ on the right. 2+ on the left.  Cardiovascular:     Rate and Rhythm: Normal rate and regular rhythm.  Pulmonary:     Effort: Pulmonary effort is normal.  Musculoskeletal:     Cervical back: Normal range of motion and neck supple. Tenderness present.  Lymphadenopathy:     Cervical: Cervical adenopathy present.  Skin:    General: Skin is warm.  Neurological:     General: No focal deficit present.     Mental Status: She is alert.  Psychiatric:        Mood and Affect: Mood normal.      UC Treatments / Results  Labs (all labs ordered are listed, but only abnormal results are displayed) Labs Reviewed - No data to display  EKG   Radiology No results found.  Procedures Procedures (including critical care time)  Medications Ordered in UC Medications - No data to display  Initial Impression / Assessment and Plan / UC Course  I have reviewed the triage vital signs and the nursing notes.  Pertinent labs & imaging results  that were available during my care of the patient were reviewed by me and considered in my medical decision making (see chart for details).  Patient was seen today for continued sore throat, tonsillar swelling and exudate.  Her throat is still swollen with exudates today.  All testing thus far has been negative, so will  not repeat today.  I will treat for tonsillitis, and recommend she see ENT if not improving as expected.  She is aware and agrees.   Final Clinical Impressions(s) / UC Diagnoses   Final diagnoses:  Acute tonsillitis, unspecified etiology     Discharge Instructions      You were seen today for sore throat.  I am treating you for tonsillitis today.  Please take augmentin twice/day x 10 days with food to avoid upset stomach.  Please get a new toothbrush in 2 days, and launder your pillowcases and bed sheets.  You can continue salt water gargles and motrin for pain and swelling.  If not improving then you will need to see ENT.  Please call [phone] for an appointment.     ED Prescriptions     Medication Sig Dispense Auth. Provider   amoxicillin-clavulanate (AUGMENTIN) 875-125 MG tablet Take 1 tablet by mouth every 12 (twelve) hours for 10 days. 20 tablet Rondel Oh, MD      PDMP not reviewed this encounter.   Rondel Oh, MD 03/28/22 1000

## 2022-04-18 ENCOUNTER — Ambulatory Visit (HOSPITAL_COMMUNITY)
Admission: EM | Admit: 2022-04-18 | Discharge: 2022-04-18 | Disposition: A | Discharge status: Home / Self Care | Payer: Self-pay | Attending: Internal Medicine | Admitting: Internal Medicine

## 2022-04-18 ENCOUNTER — Encounter (HOSPITAL_COMMUNITY): Payer: Self-pay | Admitting: Emergency Medicine

## 2022-04-18 ENCOUNTER — Other Ambulatory Visit: Payer: Self-pay

## 2022-04-18 DIAGNOSIS — R300 Dysuria: Secondary | ICD-10-CM | POA: Insufficient documentation

## 2022-04-18 DIAGNOSIS — S29012A Strain of muscle and tendon of back wall of thorax, initial encounter: Secondary | ICD-10-CM | POA: Insufficient documentation

## 2022-04-18 LAB — POCT URINALYSIS DIPSTICK, ED / UC
Bilirubin Urine: NEGATIVE
Glucose, UA: NEGATIVE mg/dL
Hgb urine dipstick: NEGATIVE
Ketones, ur: NEGATIVE mg/dL
Leukocytes,Ua: NEGATIVE
Nitrite: NEGATIVE
Protein, ur: 100 mg/dL — AB
Specific Gravity, Urine: 1.025 (ref 1.005–1.030)
Urobilinogen, UA: 0.2 mg/dL (ref 0.0–1.0)
pH: 6 (ref 5.0–8.0)

## 2022-04-18 NOTE — ED Triage Notes (Signed)
Last night had upper back pain and chills.    Last night , patient also noticed slight burning with urination.  Requesting STD testing

## 2022-04-18 NOTE — Discharge Instructions (Addendum)
Increase oral fluid intake We will call you with recommendations if labs are abnormal Your urine exam is negative Please abstain from sexual intercourse until lab results are available Return to urgent care if symptoms worsen.

## 2022-04-18 NOTE — ED Provider Notes (Addendum)
[facility]    CSN: [id number] Arrival date & time: 04/18/22  1008      History   Chief Complaint Chief Complaint  Patient presents with   Back Pain    HPI Adrienne Johnson is a 21 y.o. female comes to the urgent care with mid back pain of 1 day duration.  Patient denies any falls or trauma to the back.  Pain is mild in nature.  Pain is sharp and aggravated by palpating over the area involved.  No known relieving factors.  No heavy lifting.  Patient also complains of slight burning with urination with no frequency of urination or urinary urgency.  She has a new sexual partner and is requesting STD testing.  Patient is engaging in unprotected sexual intercourse.  HPI  History reviewed. No pertinent past medical history.  There are no problems to display for this patient.   History reviewed. No pertinent surgical history.  OB History   No obstetric history on file.      Home Medications    Prior to Admission medications   Medication Sig Start Date End Date Taking? Authorizing Provider  EPINEPHrine (EPIPEN JR) 0.15 MG/0.3ML injection Inject 0.15 mg into the muscle as needed. For shellfish allergy Has never had to use    [provider]    Family History History reviewed. No pertinent family history.  Social History Social History   Tobacco Use   Smoking status: Never   Smokeless tobacco: Never  Vaping Use   Vaping Use: Some days  Substance Use Topics   Alcohol use: Yes   Drug use: Yes    Types: Marijuana     Allergies   Shellfish allergy   Review of Systems Review of Systems As per HPI  Physical Exam Triage Vital Signs ED Triage Vitals  Enc Vitals Group     BP 04/18/22 1108 125/79     Pulse Rate 04/18/22 1108 81     Resp 04/18/22 1108 18     Temp 04/18/22 1108 98.9 F (37.2 C)     Temp Source 04/18/22 1108 Oral     SpO2 04/18/22 1108 98 %     Weight --      Height --      Head Circumference --      Peak Flow --       Pain Score 04/18/22 1106 5     Pain Loc --      Pain Edu? --      Excl. in [place]? --    No data found.  Updated Vital Signs BP 125/79 (BP Location: Left Arm)   Pulse 81   Temp 98.9 F (37.2 C) (Oral)   Resp 18   LMP 03/24/2022   SpO2 98%   Visual Acuity Right Eye Distance:   Left Eye Distance:   Bilateral Distance:    Right Eye Near:   Left Eye Near:    Bilateral Near:     Physical Exam Vitals and nursing note reviewed.  Constitutional:      Appearance: Normal appearance. She is not ill-appearing.  Cardiovascular:     Rate and Rhythm: Normal rate and regular rhythm.     Pulses: Normal pulses.     Heart sounds: Normal heart sounds.  Pulmonary:     Effort: Pulmonary effort is normal.     Breath sounds: Normal breath sounds.  Abdominal:     General: Bowel sounds are normal.     Palpations: Abdomen is soft.  Musculoskeletal:     Comments: Mild tenderness of the left upper back.  No bruising noted.  No rash noted.  Neurological:     Mental Status: She is alert.      UC Treatments / Results  Labs (all labs ordered are listed, but only abnormal results are displayed) Labs Reviewed  POCT URINALYSIS DIPSTICK, ED / UC - Abnormal; Notable for the following components:      Result Value   Protein, ur 100 (*)    All other components within normal limits  CERVICOVAGINAL ANCILLARY ONLY    EKG   Radiology No results found.  Procedures Procedures (including critical care time)  Medications Ordered in UC Medications - No data to display  Initial Impression / Assessment and Plan / UC Course  I have reviewed the triage vital signs and the nursing notes.  Pertinent labs & imaging results that were available during my care of the patient were reviewed by me and considered in my medical decision making (see chart for details).     1.  Dysuria: Point-of-care urinalysis is negative for urinary tract infection No indication for urine cultures Patient is advised to  increase oral fluid intake Cervicovaginal swab for STI evaluation.  2.  Muscle strain over upper back: Over-the-counter ibuprofen and Tylenol Gentle stretching exercises Heating pad use only 20 minutes on-20 minutes of cycle Return precautions given. Final Clinical Impressions(s) / UC Diagnoses   Final diagnoses:  Dysuria  Muscle strain of left upper back, initial encounter     Discharge Instructions      Increase oral fluid intake We will call you with recommendations if labs are abnormal Your urine exam is negative Please abstain from sexual intercourse until lab results are available Return to urgent care if symptoms worsen.   ED Prescriptions   None    PDMP not reviewed this encounter.   Merrilee Jansky, MD 04/18/22 2251    Merrilee Jansky, MD 04/18/22 2251

## 2022-04-22 LAB — CERVICOVAGINAL ANCILLARY ONLY
Chlamydia: NEGATIVE
Comment: NEGATIVE
Comment: NEGATIVE
Comment: NORMAL
Neisseria Gonorrhea: NEGATIVE
Trichomonas: NEGATIVE

## 2022-05-26 ENCOUNTER — Ambulatory Visit (HOSPITAL_COMMUNITY)
Admission: EM | Admit: 2022-05-26 | Discharge: 2022-05-26 | Disposition: A | Discharge status: Home / Self Care | Payer: Commercial Managed Care - HMO | Attending: Family Medicine | Admitting: Family Medicine

## 2022-05-26 ENCOUNTER — Encounter (HOSPITAL_COMMUNITY): Payer: Self-pay | Admitting: *Deleted

## 2022-05-26 ENCOUNTER — Other Ambulatory Visit: Payer: Self-pay

## 2022-05-26 DIAGNOSIS — J351 Hypertrophy of tonsils: Secondary | ICD-10-CM | POA: Diagnosis not present

## 2022-05-26 NOTE — Discharge Instructions (Signed)
You were seen today for enlarged tonsils.   You should make an appointment with an ENT for further discussion about this.  You may call [facility] at [phone] to make an appointment.

## 2022-05-26 NOTE — ED Provider Notes (Signed)
[facility]    CSN: [id number] Arrival date & time: 05/26/22  1615      History   Chief Complaint Chief Complaint  Patient presents with   Sore Throat    itis    HPI Adrienne Johnson is a 21 y.o. female.   She is still for continued swelling of tonsils.  She was seen several months ago for recurrent sore throat and swollen tonsils.  Place on abx for this multiple times.  She admits to not taking the medication as directed.  She is here b/c of continued swelling, but ot painful.  No runny nose, congestion, drainage.  No fevers, chills or headaches. She does snore at night, which is new.        History reviewed. No pertinent past medical history.  There are no problems to display for this patient.   History reviewed. No pertinent surgical history.  OB History   No obstetric history on file.      Home Medications    Prior to Admission medications   Medication Sig Start Date End Date Taking? Authorizing Provider  EPINEPHrine (EPIPEN JR) 0.15 MG/0.3ML injection Inject 0.15 mg into the muscle as needed. For shellfish allergy Has never had to use    [provider]    Family History History reviewed. No pertinent family history.  Social History Social History   Tobacco Use   Smoking status: Never   Smokeless tobacco: Never  Vaping Use   Vaping Use: Some days  Substance Use Topics   Alcohol use: Yes   Drug use: Yes    Types: Marijuana     Allergies   Shellfish allergy   Review of Systems Review of Systems  Constitutional: Negative.   HENT: Negative.    Respiratory: Negative.    Cardiovascular: Negative.   Gastrointestinal: Negative.   Musculoskeletal: Negative.   Psychiatric/Behavioral: Negative.       Physical Exam Triage Vital Signs ED Triage Vitals  Enc Vitals Group     BP 05/26/22 1724 99/63     Pulse Rate 05/26/22 1724 76     Resp 05/26/22 1724 18     Temp 05/26/22 1724 98.3 F (36.8 C)     Temp src --       SpO2 05/26/22 1724 99 %     Weight --      Height --      Head Circumference --      Peak Flow --      Pain Score 05/26/22 1722 0     Pain Loc --      Pain Edu? --      Excl. in [place]? --    No data found.  Updated Vital Signs BP 99/63   Pulse 76   Temp 98.3 F (36.8 C)   Resp 18   LMP 04/29/2022   SpO2 99%   Visual Acuity Right Eye Distance:   Left Eye Distance:   Bilateral Distance:    Right Eye Near:   Left Eye Near:    Bilateral Near:     Physical Exam Constitutional:      Appearance: She is well-developed.  HENT:     Head: Normocephalic and atraumatic.     Nose: No congestion or rhinorrhea.     Mouth/Throat:     Pharynx: No pharyngeal swelling, oropharyngeal exudate, posterior oropharyngeal erythema or uvula swelling.     Tonsils: No tonsillar exudate. 3+ on the right. 3+ on the left.  Cardiovascular:  Rate and Rhythm: Normal rate and regular rhythm.     Heart sounds: Normal heart sounds.  Pulmonary:     Effort: Pulmonary effort is normal.  Musculoskeletal:     Cervical back: Normal range of motion and neck supple.  Lymphadenopathy:     Cervical: No cervical adenopathy.  Neurological:     General: No focal deficit present.     Mental Status: She is alert.  Psychiatric:        Mood and Affect: Mood normal.      UC Treatments / Results  Labs (all labs ordered are listed, but only abnormal results are displayed) Labs Reviewed - No data to display  EKG   Radiology No results found.  Procedures Procedures (including critical care time)  Medications Ordered in UC Medications - No data to display  Initial Impression / Assessment and Plan / UC Course  I have reviewed the triage vital signs and the nursing notes.  Pertinent labs & imaging results that were available during my care of the patient were reviewed by me and considered in my medical decision making (see chart for details).   Final Clinical Impressions(s) / UC Diagnoses   Final  diagnoses:  Enlarged tonsils     Discharge Instructions      You were seen today for enlarged tonsils.   You should make an appointment with an ENT for further discussion about this.  You may call [facility] at [phone] to make an appointment.     ED Prescriptions   None    PDMP not reviewed this encounter.   Jannifer Franklin, MD 05/26/22 [phone]

## 2022-05-26 NOTE — ED Triage Notes (Signed)
Pt reports she did not take the anti-bx as directed and still has Sx's of tonsill.

## 2022-05-31 ENCOUNTER — Ambulatory Visit (HOSPITAL_COMMUNITY)
Admission: EM | Admit: 2022-05-31 | Discharge: 2022-05-31 | Disposition: A | Discharge status: Home / Self Care | Payer: Commercial Managed Care - HMO | Attending: Emergency Medicine | Admitting: Emergency Medicine

## 2022-05-31 ENCOUNTER — Encounter (HOSPITAL_COMMUNITY): Payer: Self-pay | Admitting: Emergency Medicine

## 2022-05-31 DIAGNOSIS — Z3202 Encounter for pregnancy test, result negative: Secondary | ICD-10-CM

## 2022-05-31 LAB — POC URINE PREG, ED: Preg Test, Ur: NEGATIVE

## 2022-05-31 NOTE — ED Provider Notes (Signed)
[facility]    CSN: [id number] Arrival date & time: 05/31/22  1018      History   Chief Complaint Chief Complaint  Patient presents with   Possible Pregnancy    HPI Adrienne Johnson is a 21 y.o. female.  Presents for pregnancy test Menstrual cycle is 6 days late Recent unprotected intercourse  Developed nausea yesterday No vomiting  Reports cycles are usually pretty regular but can sometimes be off a couple days She does report increased stress lately with "a lot going on"  History reviewed. No pertinent past medical history.  There are no problems to display for this patient.   History reviewed. No pertinent surgical history.  OB History   No obstetric history on file.      Home Medications    Prior to Admission medications   Medication Sig Start Date End Date Taking? Authorizing Provider  EPINEPHrine (EPIPEN JR) 0.15 MG/0.3ML injection Inject 0.15 mg into the muscle as needed. For shellfish allergy Has never had to use    [provider]    Family History History reviewed. No pertinent family history.  Social History Social History   Tobacco Use   Smoking status: Never   Smokeless tobacco: Never  Vaping Use   Vaping Use: Some days  Substance Use Topics   Alcohol use: Yes   Drug use: Yes    Types: Marijuana     Allergies   Shellfish allergy   Review of Systems Review of Systems As per HPI  Physical Exam Triage Vital Signs ED Triage Vitals  Enc Vitals Group     BP 05/31/22 1057 129/81     Pulse Rate 05/31/22 1057 68     Resp 05/31/22 1057 17     Temp 05/31/22 1057 98.3 F (36.8 C)     Temp Source 05/31/22 1057 Oral     SpO2 05/31/22 1057 97 %     Weight --      Height --      Head Circumference --      Peak Flow --      Pain Score 05/31/22 1056 0     Pain Loc --      Pain Edu? --      Excl. in [place]? --    No data found.  Updated Vital Signs BP 129/81 (BP Location: Right Arm)   Pulse 68   Temp 98.3 F  (36.8 C) (Oral)   Resp 17   LMP 04/29/2022   SpO2 97%   Physical Exam Vitals and nursing note reviewed.  Constitutional:      General: She is not in acute distress.    Appearance: Normal appearance.  HENT:     Mouth/Throat:     Pharynx: Oropharynx is clear.  Cardiovascular:     Rate and Rhythm: Normal rate and regular rhythm.     Pulses: Normal pulses.  Pulmonary:     Effort: Pulmonary effort is normal.  Neurological:     Mental Status: She is alert and oriented to person, place, and time.     UC Treatments / Results  Labs (all labs ordered are listed, but only abnormal results are displayed) Labs Reviewed  POC URINE PREG, ED    EKG  Radiology No results found.  Procedures Procedures  Medications Ordered in UC Medications - No data to display  Initial Impression / Assessment and Plan / UC Course  I have reviewed the triage vital signs and the nursing notes.  Pertinent  labs & imaging results that were available during my care of the patient were reviewed by me and considered in my medical decision making (see chart for details).  UPT negative Discussed possible reasons for delayed cycle  Recommend to monitor for cycle onset. Can return if needed or use OTC pregnancy test. Understands may take 10 days for positive test after unprotected intercourse. Discussed safe sex practices. Provided ob/gyn clinic to establish with if needed.  Final Clinical Impressions(s) / UC Diagnoses   Final diagnoses:  Pregnancy examination or test, negative result     Discharge Instructions      Monitor for onset of cycle. You are welcome to return as needed. Sometimes test will not be positive until 10 days after encounter.      ED Prescriptions   None    PDMP not reviewed this encounter.   [place], [place] 05/31/22 1135

## 2022-05-31 NOTE — Discharge Instructions (Addendum)
Monitor for onset of cycle. You are welcome to return as needed. Sometimes test will not be positive until 10 days after encounter.

## 2022-05-31 NOTE — ED Triage Notes (Signed)
Pt reports her menstrual cycle is 6 days late. States she has been nauseous since yesterday.

## 2022-07-08 ENCOUNTER — Ambulatory Visit (HOSPITAL_COMMUNITY)
Admission: EM | Admit: 2022-07-08 | Discharge: 2022-07-08 | Disposition: A | Discharge status: Home / Self Care | Payer: Commercial Managed Care - HMO | Attending: Internal Medicine | Admitting: Internal Medicine

## 2022-07-08 ENCOUNTER — Encounter (HOSPITAL_COMMUNITY): Payer: Self-pay | Admitting: *Deleted

## 2022-07-08 ENCOUNTER — Other Ambulatory Visit: Payer: Self-pay

## 2022-07-08 DIAGNOSIS — J101 Influenza due to other identified influenza virus with other respiratory manifestations: Secondary | ICD-10-CM | POA: Diagnosis not present

## 2022-07-08 LAB — POC INFLUENZA A AND B ANTIGEN (URGENT CARE ONLY)
INFLUENZA A ANTIGEN, POC: NEGATIVE
INFLUENZA B ANTIGEN, POC: POSITIVE — AB

## 2022-07-08 MED ORDER — OSELTAMIVIR PHOSPHATE 75 MG PO CAPS: 75.0000 mg | ORAL_CAPSULE | Freq: Two times a day (BID) | ORAL | 0 refills | Status: DC

## 2022-07-08 MED ORDER — IBUPROFEN 600 MG PO TABS: 600.0000 mg | ORAL_TABLET | Freq: Four times a day (QID) | ORAL | 0 refills | Status: DC | PRN

## 2022-07-08 NOTE — ED Provider Notes (Signed)
[place]    CSN: [id number] Arrival date & time: 07/08/22  1456      History   Chief Complaint Chief Complaint  Patient presents with   Headache   Chills   Generalized Body Aches   Cough    HPI Adrienne Johnson is a 22 y.o. female comes to the urgent care with fever, headache, generalized body aches, chills of 2 days duration.  Patient's symptoms started abruptly and has been persistent.  She denies any nausea or vomiting.  No diarrhea.  No shortness of breath or wheezing.  Patient has a cough which is not productive of sputum.  She denies any chest tightness.  No sick contacts.  No abdominal pain or distention.  No rash noted.   HPI  History reviewed. No pertinent past medical history.  There are no problems to display for this patient.   History reviewed. No pertinent surgical history.  OB History   No obstetric history on file.      Home Medications    Prior to Admission medications   Medication Sig Start Date End Date Taking? Authorizing Provider  ibuprofen (ADVIL) 600 MG tablet Take 1 tablet (600 mg total) by mouth every 6 (six) hours as needed. 07/08/22  Yes Shan Valdes, Myrene Galas, MD  oseltamivir (TAMIFLU) 75 MG capsule Take 1 capsule (75 mg total) by mouth every 12 (twelve) hours. 07/08/22  Yes Jimma Ortman, Myrene Galas, MD  EPINEPHrine (EPIPEN JR) 0.15 MG/0.3ML injection Inject 0.15 mg into the muscle as needed. For shellfish allergy Has never had to use    [provider]    Family History History reviewed. No pertinent family history.  Social History Social History   Tobacco Use   Smoking status: Never   Smokeless tobacco: Never  Vaping Use   Vaping Use: Some days  Substance Use Topics   Alcohol use: Yes   Drug use: Yes    Types: Marijuana     Allergies   Shellfish allergy   Review of Systems Review of Systems  Constitutional:  Positive for activity change, chills and fever. Negative for fatigue.  HENT:  Positive for congestion  and sore throat.   Respiratory:  Positive for cough. Negative for chest tightness and wheezing.   Gastrointestinal: Negative.   Genitourinary: Negative.   Musculoskeletal:  Positive for myalgias.  Neurological:  Positive for headaches.     Physical Exam Triage Vital Signs ED Triage Vitals  Enc Vitals Group     BP 07/08/22 1648 128/84     Pulse Rate 07/08/22 1648 99     Resp 07/08/22 1648 18     Temp 07/08/22 1648 (!) 100.7 F (38.2 C)     Temp src --      SpO2 07/08/22 1648 99 %     Weight --      Height --      Head Circumference --      Peak Flow --      Pain Score 07/08/22 1647 7     Pain Loc --      Pain Edu? --      Excl. in [place]? --    No data found.  Updated Vital Signs BP 128/84   Pulse 99   Temp (!) 100.7 F (38.2 C)   Resp 18   LMP 07/08/2022   SpO2 99%   Visual Acuity Right Eye Distance:   Left Eye Distance:   Bilateral Distance:    Right Eye Near:   Left  Eye Near:    Bilateral Near:     Physical Exam Vitals and nursing note reviewed.  Constitutional:      Appearance: She is well-developed. She is ill-appearing.  HENT:     Mouth/Throat:     Mouth: Mucous membranes are moist.     Comments: Mild pharyngeal erythema Cardiovascular:     Rate and Rhythm: Normal rate and regular rhythm.  Pulmonary:     Effort: Pulmonary effort is normal.     Breath sounds: Normal breath sounds.  Abdominal:     General: Bowel sounds are normal.     Palpations: Abdomen is soft.  Musculoskeletal:     Cervical back: Normal range of motion and neck supple.  Neurological:     Mental Status: She is alert.     GCS: GCS eye subscore is 4. GCS verbal subscore is 5. GCS motor subscore is 6.      UC Treatments / Results  Labs (all labs ordered are listed, but only abnormal results are displayed) Labs Reviewed  POC INFLUENZA A AND B ANTIGEN (URGENT CARE ONLY) - Abnormal; Notable for the following components:      Result Value   INFLUENZA B ANTIGEN, POC POSITIVE  (*)    All other components within normal limits    EKG   Radiology No results found.  Procedures Procedures (including critical care time)  Medications Ordered in UC Medications - No data to display  Initial Impression / Assessment and Plan / UC Course  I have reviewed the triage vital signs and the nursing notes.  Pertinent labs & imaging results that were available during my care of the patient were reviewed by me and considered in my medical decision making (see chart for details).     1.  Influenza B infection: Tamiflu 75 mg twice daily for 5 days Ibuprofen as needed for pain and/or fever Increase oral fluid intake If you have worsening symptoms please return to urgent care to be reevaluated. Final Clinical Impressions(s) / UC Diagnoses   Final diagnoses:  Influenza B     Discharge Instructions      Please increase oral fluid intake Take medications as prescribed Tylenol or ibuprofen as needed for pain and/or fever Your flu test is positive for influenza B If you have worsening symptoms please return to urgent care to be reevaluated.     ED Prescriptions     Medication Sig Dispense Auth. Provider   oseltamivir (TAMIFLU) 75 MG capsule Take 1 capsule (75 mg total) by mouth every 12 (twelve) hours. 10 capsule Jeylin Woodmansee, Myrene Galas, MD   ibuprofen (ADVIL) 600 MG tablet Take 1 tablet (600 mg total) by mouth every 6 (six) hours as needed. 30 tablet Teruo Stilley, Myrene Galas, MD      PDMP not reviewed this encounter.   Chase Picket, MD 07/08/22 [phone]

## 2022-07-08 NOTE — Discharge Instructions (Addendum)
Please increase oral fluid intake Take medications as prescribed Tylenol or ibuprofen as needed for pain and/or fever Your flu test is positive for influenza B If you have worsening symptoms please return to urgent care to be reevaluated.

## 2022-07-08 NOTE — ED Triage Notes (Signed)
Pt reports for 2 days having a cough,chills,body aches and a HA.

## 2022-07-19 ENCOUNTER — Other Ambulatory Visit: Payer: Self-pay

## 2022-07-19 ENCOUNTER — Encounter (HOSPITAL_BASED_OUTPATIENT_CLINIC_OR_DEPARTMENT_OTHER): Payer: Self-pay

## 2022-07-19 DIAGNOSIS — Z20822 Contact with and (suspected) exposure to covid-19: Secondary | ICD-10-CM | POA: Diagnosis not present

## 2022-07-19 DIAGNOSIS — J020 Streptococcal pharyngitis: Secondary | ICD-10-CM | POA: Diagnosis not present

## 2022-07-19 DIAGNOSIS — J029 Acute pharyngitis, unspecified: Secondary | ICD-10-CM | POA: Diagnosis present

## 2022-07-19 NOTE — ED Triage Notes (Signed)
Pt reports sore throat and fever onset yesterday but worsened today. She reports her tonsils are swollen and she feels like she has a lot of mucus in her throat.

## 2022-07-20 ENCOUNTER — Emergency Department (HOSPITAL_BASED_OUTPATIENT_CLINIC_OR_DEPARTMENT_OTHER)
Admission: EM | Admit: 2022-07-20 | Discharge: 2022-07-20 | Disposition: A | Discharge status: Home / Self Care | Payer: Commercial Managed Care - HMO | Attending: Emergency Medicine | Admitting: Emergency Medicine

## 2022-07-20 DIAGNOSIS — J020 Streptococcal pharyngitis: Secondary | ICD-10-CM

## 2022-07-20 LAB — RESP PANEL BY RT-PCR (RSV, FLU A&B, COVID)  RVPGX2
Influenza A by PCR: NEGATIVE
Influenza B by PCR: NEGATIVE
Resp Syncytial Virus by PCR: NEGATIVE
SARS Coronavirus 2 by RT PCR: NEGATIVE

## 2022-07-20 LAB — GROUP A STREP BY PCR: Group A Strep by PCR: DETECTED — AB

## 2022-07-20 MED ORDER — SODIUM CHLORIDE 0.9 % IV BOLUS
1000.0000 mL | Freq: Once | INTRAVENOUS | Status: AC
  Administered 2022-07-20: 1000 mL via INTRAVENOUS

## 2022-07-20 MED ORDER — SODIUM CHLORIDE 0.9 % IV SOLN
3.0000 g | Freq: Once | INTRAVENOUS | Status: AC
  Administered 2022-07-20: 3 g via INTRAVENOUS

## 2022-07-20 MED ORDER — AMOXICILLIN 250 MG/5ML PO SUSR: 500.0000 mg | Freq: Two times a day (BID) | ORAL | 0 refills | Status: DC

## 2022-07-20 MED ORDER — ONDANSETRON HCL 4 MG/2ML IJ SOLN
4.0000 mg | Freq: Once | INTRAMUSCULAR | Status: AC
  Administered 2022-07-20: 4 mg via INTRAVENOUS
  Filled 2022-07-20: qty 2

## 2022-07-20 MED ORDER — DEXAMETHASONE SODIUM PHOSPHATE 10 MG/ML IJ SOLN
10.0000 mg | Freq: Once | INTRAMUSCULAR | Status: AC
  Administered 2022-07-20: 10 mg via INTRAVENOUS
  Filled 2022-07-20: qty 1

## 2022-07-20 NOTE — ED Provider Notes (Signed)
[place] [facility] Provider Note   CSN: [id number] Arrival date & time: 07/19/22  2318     History  Chief Complaint  Patient presents with   Sore Throat    Adrienne Johnson is a 22 y.o. female.  The history is provided by the patient.  Sore Throat This is a new problem. The current episode started 12 to 24 hours ago. The problem occurs constantly. The problem has been gradually worsening. The symptoms are aggravated by swallowing. Nothing relieves the symptoms.   Patient reports sore throat over the past day.  She reports increased mucus.  She is able to swallow but it hurts.    Home Medications Prior to Admission medications   Medication Sig Start Date End Date Taking? Authorizing Provider  amoxicillin (AMOXIL) 250 MG/5ML suspension Take 10 mLs (500 mg total) by mouth 2 (two) times daily. 07/20/22  Yes Ripley Fraise, MD  EPINEPHrine [facility] JR) 0.15 MG/0.3ML injection Inject 0.15 mg into the muscle as needed. For shellfish allergy Has never had to use    [provider]  ibuprofen (ADVIL) 600 MG tablet Take 1 tablet (600 mg total) by mouth every 6 (six) hours as needed. 07/08/22   LampteyMyrene Galas, MD  oseltamivir (TAMIFLU) 75 MG capsule Take 1 capsule (75 mg total) by mouth every 12 (twelve) hours. 07/08/22   Chase Picket, MD      Allergies    Shellfish allergy    Review of Systems   Review of Systems  Physical Exam Updated Vital Signs BP 97/63 (BP Location: Left Arm)   Pulse 93   Temp 99.5 F (37.5 C) (Oral)   Resp 18   Ht 1.702 m (5\' 7" )   Wt 54.4 kg   LMP 07/08/2022   SpO2 100%   BMI 18.79 kg/m  Physical Exam CONSTITUTIONAL: Well developed/well nourished HEAD: Normocephalic/atraumatic EYES: EOMI/PERRL ENMT: Mucous membranes moist, uvula midline but tonsils are enlarged and symmetric.  Erythema is noted.  No drooling, no stridor. NECK: supple no meningeal signs, mild left cervical lymphadenopathy NEURO:  Pt is awake/alert/appropriate, moves all extremitiesx4.  No facial droop.   EXTREMITIES: full ROM SKIN: warm, color normal PSYCH: Mildly anxious ED Results / Procedures / Treatments   Labs (all labs ordered are listed, but only abnormal results are displayed) Labs Reviewed  GROUP A STREP BY PCR - Abnormal; Notable for the following components:      Result Value   Group A Strep by PCR DETECTED (*)    All other components within normal limits  RESP PANEL BY RT-PCR (RSV, FLU A&B, COVID)  RVPGX2    EKG None  Radiology No results found.  Procedures Procedures    Medications Ordered in ED Medications  dexamethasone (DECADRON) injection 10 mg (10 mg Intravenous Given 07/20/22 0332)  sodium chloride 0.9 % bolus 1,000 mL ( Intravenous Stopped 07/20/22 0434)  ondansetron (ZOFRAN) injection 4 mg (4 mg Intravenous Given 07/20/22 0331)  Ampicillin-Sulbactam (UNASYN) 3 g in sodium chloride 0.9 % 100 mL IVPB (0 g Intravenous Stopped 07/20/22 0410)    ED Course/ Medical Decision Making/ A&P Clinical Course as of 07/20/22 0457  Sat Jul 20, 2022  1324 Patient presents with sore throat found to have strep pharyngitis.  Her tonsils are symmetrically enlarged bilaterally and she appears uncomfortable.  Will treat with fluids, Decadron and antibiotics.  No signs of peritonsillar abscess.  I anticipate discharge after medications [DW]  0457 Patient improved, no drooling, no  stridor.  She will be discharged home on amoxicillin.  We discussed strict return precautions [DW]    Clinical Course User Index [DW] Ripley Fraise, MD                             Medical Decision Making Risk Prescription drug management.           Final Clinical Impression(s) / ED Diagnoses Final diagnoses:  Strep pharyngitis    Rx / DC Orders ED Discharge Orders          Ordered    amoxicillin (AMOXIL) 250 MG/5ML suspension  2 times daily        07/20/22 0455              Ripley Fraise,  MD 07/20/22 [phone]

## 2022-07-31 ENCOUNTER — Encounter (HOSPITAL_COMMUNITY): Payer: Self-pay

## 2022-07-31 ENCOUNTER — Ambulatory Visit (HOSPITAL_COMMUNITY)
Admission: EM | Admit: 2022-07-31 | Discharge: 2022-07-31 | Disposition: A | Discharge status: Home / Self Care | Payer: Commercial Managed Care - HMO | Attending: Physician Assistant | Admitting: Physician Assistant

## 2022-07-31 DIAGNOSIS — Z113 Encounter for screening for infections with a predominantly sexual mode of transmission: Secondary | ICD-10-CM | POA: Diagnosis present

## 2022-07-31 LAB — HIV ANTIBODY (ROUTINE TESTING W REFLEX): HIV Screen 4th Generation wRfx: NONREACTIVE

## 2022-07-31 NOTE — Discharge Instructions (Signed)
Monitor your MyChart for your results.  We will contact you if you are positive for any STIs.  Please abstain from sex until you receive results.  Use a condom for the sexual encounter.  If you are positive for any STIs all partners will need to be tested and treated as well.  If you develop any symptoms including abdominal pain, pelvic pain, fever, nausea, vomiting you should be seen immediately.

## 2022-07-31 NOTE — ED Triage Notes (Signed)
Pt is here for STD as a preventive

## 2022-07-31 NOTE — ED Provider Notes (Signed)
[place]    CSN: [id number] Arrival date & time: 07/31/22  0802      History   Chief Complaint Chief Complaint  Patient presents with   Exposure to STD    HPI Adrienne Johnson is a 22 y.o. female.   Patient presents today requesting STI evaluation.  Denies any specific exposures.  She was last tested in November 2023 and negative.  She denies any symptoms including abdominal pain, pelvic pain, fever, nausea, vomiting, genital lesions.  She is confident that she is not pregnant.  She was treated with amoxicillin for strep throat earlier this month but denies additional antibiotics in the past 90 days.  She is requesting complete STI panel.    History reviewed. No pertinent past medical history.  There are no problems to display for this patient.   History reviewed. No pertinent surgical history.  OB History   No obstetric history on file.      Home Medications    Prior to Admission medications   Medication Sig Start Date End Date Taking? Authorizing Provider  ibuprofen (ADVIL) 600 MG tablet Take 1 tablet (600 mg total) by mouth every 6 (six) hours as needed. 07/08/22  Yes Lamptey, Myrene Galas, MD  EPINEPHrine (EPIPEN JR) 0.15 MG/0.3ML injection Inject 0.15 mg into the muscle as needed. For shellfish allergy Has never had to use    [provider]    Family History History reviewed. No pertinent family history.  Social History Social History   Tobacco Use   Smoking status: Never   Smokeless tobacco: Never  Vaping Use   Vaping Use: Some days  Substance Use Topics   Alcohol use: Yes   Drug use: Yes    Types: Marijuana     Allergies   Shellfish allergy   Review of Systems Review of Systems  Constitutional:  Negative for activity change, appetite change, fatigue and fever.  Gastrointestinal:  Negative for abdominal pain, diarrhea, nausea and vomiting.  Genitourinary:  Negative for dysuria, frequency, urgency, vaginal bleeding, vaginal  discharge and vaginal pain.     Physical Exam Triage Vital Signs ED Triage Vitals  Enc Vitals Group     BP 07/31/22 0823 117/78     Pulse Rate 07/31/22 0823 78     Resp 07/31/22 0823 12     Temp 07/31/22 0823 98 F (36.7 C)     Temp Source 07/31/22 0823 Oral     SpO2 07/31/22 0823 95 %     Weight --      Height --      Head Circumference --      Peak Flow --      Pain Score 07/31/22 0821 0     Pain Loc --      Pain Edu? --      Excl. in [place]? --    No data found.  Updated Vital Signs BP 117/78 (BP Location: Right Arm)   Pulse 78   Temp 98 F (36.7 C) (Oral)   Resp 12   LMP 07/08/2022   SpO2 95%   Visual Acuity Right Eye Distance:   Left Eye Distance:   Bilateral Distance:    Right Eye Near:   Left Eye Near:    Bilateral Near:     Physical Exam Vitals reviewed.  Constitutional:      General: She is awake. She is not in acute distress.    Appearance: Normal appearance. She is well-developed. She is not ill-appearing.  Comments: Very pleasant female appears stated age in no acute distress sitting comfortably in exam room  HENT:     Head: Normocephalic and atraumatic.  Cardiovascular:     Rate and Rhythm: Normal rate and regular rhythm.     Heart sounds: Normal heart sounds, S1 normal and S2 normal. No murmur heard. Pulmonary:     Effort: Pulmonary effort is normal.     Breath sounds: Normal breath sounds. No wheezing, rhonchi or rales.     Comments: Clear to auscultation bilaterally Abdominal:     General: Bowel sounds are normal.     Palpations: Abdomen is soft.     Tenderness: There is no abdominal tenderness. There is no right CVA tenderness, left CVA tenderness, guarding or rebound.     Comments: Benign abdominal exam  Genitourinary:    Comments: Exam deferred Psychiatric:        Behavior: Behavior is cooperative.      UC Treatments / Results  Labs (all labs ordered are listed, but only abnormal results are displayed) Labs Reviewed  RPR   HIV ANTIBODY (ROUTINE TESTING W REFLEX)  CERVICOVAGINAL ANCILLARY ONLY    EKG   Radiology No results found.  Procedures Procedures (including critical care time)  Medications Ordered in UC Medications - No data to display  Initial Impression / Assessment and Plan / UC Course  I have reviewed the triage vital signs and the nursing notes.  Pertinent labs & imaging results that were available during my care of the patient were reviewed by me and considered in my medical decision making (see chart for details).     Patient is well-appearing, afebrile, nontoxic, nontachycardic.  STI testing obtained today-results pending.  Patient was encouraged to monitor MyChart for these results we will contact her if anything is positive and we need to arrange any treatment.  She is to abstain from sex until results are available.  We discussed the importance of safe sex practices.  Discussed that if she develops any symptoms she should return for reevaluation.  All questions answered to patient satisfaction.  Final Clinical Impressions(s) / UC Diagnoses   Final diagnoses:  Screen for STD (sexually transmitted disease)     Discharge Instructions      Monitor your MyChart for your results.  We will contact you if you are positive for any STIs.  Please abstain from sex until you receive results.  Use a condom for the sexual encounter.  If you are positive for any STIs all partners will need to be tested and treated as well.  If you develop any symptoms including abdominal pain, pelvic pain, fever, nausea, vomiting you should be seen immediately.     ED Prescriptions   None    PDMP not reviewed this encounter.   Terrilee Croak, PA-C 07/31/22 [id number]

## 2022-08-01 LAB — RPR: RPR Ser Ql: NONREACTIVE

## 2022-08-01 LAB — CERVICOVAGINAL ANCILLARY ONLY
Bacterial Vaginitis (gardnerella): NEGATIVE
Candida Glabrata: NEGATIVE
Candida Vaginitis: POSITIVE — AB
Chlamydia: NEGATIVE
Comment: NEGATIVE
Comment: NEGATIVE
Comment: NEGATIVE
Comment: NEGATIVE
Comment: NEGATIVE
Comment: NORMAL
Neisseria Gonorrhea: NEGATIVE
Trichomonas: NEGATIVE

## 2022-08-05 ENCOUNTER — Telehealth (HOSPITAL_COMMUNITY): Payer: Self-pay | Admitting: Emergency Medicine

## 2022-08-05 MED ORDER — FLUCONAZOLE 150 MG PO TABS: 150.0000 mg | ORAL_TABLET | Freq: Once | ORAL | 0 refills | Status: AC

## 2024-01-24 ENCOUNTER — Encounter (HOSPITAL_COMMUNITY): Payer: Self-pay | Admitting: Emergency Medicine

## 2024-01-24 ENCOUNTER — Ambulatory Visit (HOSPITAL_COMMUNITY)
Admission: EM | Admit: 2024-01-24 | Discharge: 2024-01-24 | Disposition: A | Discharge status: Home / Self Care | Payer: Self-pay | Attending: Internal Medicine | Admitting: Internal Medicine

## 2024-01-24 DIAGNOSIS — J038 Acute tonsillitis due to other specified organisms: Secondary | ICD-10-CM | POA: Insufficient documentation

## 2024-01-24 DIAGNOSIS — J029 Acute pharyngitis, unspecified: Secondary | ICD-10-CM | POA: Insufficient documentation

## 2024-01-24 LAB — POCT RAPID STREP A (OFFICE): Rapid Strep A Screen: NEGATIVE

## 2024-01-24 MED ORDER — AMOXICILLIN 875 MG PO TABS: 875.0000 mg | ORAL_TABLET | Freq: Two times a day (BID) | ORAL | 0 refills | Status: AC

## 2024-01-24 MED ORDER — PREDNISONE 20 MG PO TABS: 40.0000 mg | ORAL_TABLET | Freq: Every day | ORAL | 0 refills | Status: AC

## 2024-01-24 NOTE — Discharge Instructions (Addendum)
 Strep testing done today was negative however we will send this for a culture. Symptoms and physical exam findings are consistent with acute tonsillitis.  Due to the severity of symptoms we will treat with the following:   Amoxicillin  875 mg twice daily for 10 days.  This is an antibiotic.  Take this with food.  Prednisone  40 mg (2 tablets) once daily for 5 days. Take this in the morning.  This is a steroid to help with inflammation and pain. Make sure to stay hydrated by drinking plenty of water. If you develop severe difficulty swallowing or any difficulty breathing then recommend going to the emergency room immediately Return to urgent care or PCP if symptoms worsen or fail to resolve.

## 2024-01-24 NOTE — ED Provider Notes (Signed)
 [facility]    CSN: [id number] Arrival date & time: 01/24/24  1452      History   Chief Complaint Chief Complaint  Patient presents with   Sore Throat    HPI Adrienne Johnson is a 23 y.o. female.   23 year old female presents urgent care with complaints of sore throat, trouble swallowing and fever.  She reports her symptoms started about 2 days ago.  She has been running a fever around 100 to 101.  She reports that the sore throat has gotten worse.  She is not having any difficulty breathing but it is very painful to swallow.  She is able to swallow her own saliva without difficulty.  She denies any sick exposures that she knows of.  She denies any cough, shortness of breath, congestion, chest pain   Sore Throat Pertinent negatives include no chest pain, no abdominal pain and no shortness of breath.    History reviewed. No pertinent past medical history.  There are no active problems to display for this patient.   History reviewed. No pertinent surgical history.  OB History   No obstetric history on file.      Home Medications    Prior to Admission medications   Medication Sig Start Date End Date Taking? Authorizing Provider  amoxicillin  (AMOXIL ) 875 MG tablet Take 1 tablet (875 mg total) by mouth 2 (two) times daily for 10 days. 01/24/24 02/03/24 Yes Apollonia Amini A, PA-C  predniSONE  (DELTASONE ) 20 MG tablet Take 2 tablets (40 mg total) by mouth daily with breakfast for 5 days. 01/24/24 01/29/24 Yes Valyncia Wiens A, PA-C  EPINEPHrine (EPIPEN JR) 0.15 MG/0.3ML injection Inject 0.15 mg into the muscle as needed. For shellfish allergy Has never had to use Patient not taking: Reported on 01/24/2024    [provider]  ibuprofen  (ADVIL ) 600 MG tablet Take 1 tablet (600 mg total) by mouth every 6 (six) hours as needed. Patient not taking: Reported on 01/24/2024 07/08/22   Blaise Aleene KIDD, MD    Family History No family history on file.  Social  History Social History   Tobacco Use   Smoking status: Never   Smokeless tobacco: Never  Vaping Use   Vaping status: Some Days  Substance Use Topics   Alcohol use: Never   Drug use: Never    Types: Marijuana     Allergies   Shellfish allergy   Review of Systems Review of Systems  Constitutional:  Positive for chills and fever.  HENT:  Positive for sore throat and trouble swallowing. Negative for ear pain.   Eyes:  Negative for pain and visual disturbance.  Respiratory:  Negative for cough and shortness of breath.   Cardiovascular:  Negative for chest pain and palpitations.  Gastrointestinal:  Negative for abdominal pain and vomiting.  Genitourinary:  Negative for dysuria and hematuria.  Musculoskeletal:  Negative for arthralgias and back pain.  Skin:  Negative for color change and rash.  Neurological:  Negative for seizures and syncope.  All other systems reviewed and are negative.    Physical Exam Triage Vital Signs ED Triage Vitals  Encounter Vitals Group     BP --      Girls Systolic BP Percentile --      Girls Diastolic BP Percentile --      Boys Systolic BP Percentile --      Boys Diastolic BP Percentile --      Pulse Rate 01/24/24 1559 94     Resp 01/24/24  1559 20     Temp 01/24/24 1559 98.1 F (36.7 C)     Temp Source 01/24/24 1559 Oral     SpO2 01/24/24 1559 98 %     Weight --      Height --      Head Circumference --      Peak Flow --      Pain Score 01/24/24 1600 7     Pain Loc --      Pain Education --      Exclude from Growth Chart --    No data found.  Updated Vital Signs Pulse 94   Temp 98.1 F (36.7 C) (Oral)   Resp 20   LMP 01/09/2024 (Exact Date)   SpO2 98%   Visual Acuity Right Eye Distance:   Left Eye Distance:   Bilateral Distance:    Right Eye Near:   Left Eye Near:    Bilateral Near:     Physical Exam Vitals and nursing note reviewed.  Constitutional:      General: She is not in acute distress.    Appearance: She  is well-developed.  HENT:     Head: Normocephalic and atraumatic.     Right Ear: Tympanic membrane normal.     Left Ear: Tympanic membrane normal.     Mouth/Throat:     Mouth: Mucous membranes are moist.     Pharynx: Posterior oropharyngeal erythema present. No oropharyngeal exudate.     Tonsils: No tonsillar exudate or tonsillar abscesses. 3+ on the right. 3+ on the left.     Comments: Tonsils are kissing but able to swallow without trouble Eyes:     Conjunctiva/sclera: Conjunctivae normal.  Cardiovascular:     Rate and Rhythm: Normal rate and regular rhythm.     Heart sounds: No murmur heard. Pulmonary:     Effort: Pulmonary effort is normal. No respiratory distress.     Breath sounds: Normal breath sounds.  Abdominal:     Palpations: Abdomen is soft.     Tenderness: There is no abdominal tenderness.  Musculoskeletal:        General: No swelling.     Cervical back: Normal range of motion and neck supple.  Lymphadenopathy:     Cervical: No cervical adenopathy.  Skin:    General: Skin is warm and dry.     Capillary Refill: Capillary refill takes less than 2 seconds.  Neurological:     Mental Status: She is alert.  Psychiatric:        Mood and Affect: Mood normal.      UC Treatments / Results  Labs (all labs ordered are listed, but only abnormal results are displayed) Labs Reviewed  CULTURE, GROUP A STREP [facility])  POCT RAPID STREP A (OFFICE)    EKG   Radiology No results found.  Procedures Procedures (including critical care time)  Medications Ordered in UC Medications - No data to display  Initial Impression / Assessment and Plan / UC Course  I have reviewed the triage vital signs and the nursing notes.  Pertinent labs & imaging results that were available during my care of the patient were reviewed by me and considered in my medical decision making (see chart for details).     Sore throat - Plan: POC rapid strep A, POC rapid strep A  Acute tonsillitis  due to other specified organisms   Strep testing done today was negative however we will send this for a culture. Symptoms and physical exam findings  are consistent with acute tonsillitis.  Due to the severity of symptoms we will treat with the following:   Amoxicillin  875 mg twice daily for 7 days.  This is an antibiotic.  Take this with food.  Prednisone  40 mg (2 tablets) once daily for 5 days. Take this in the morning.  This is a steroid to help with inflammation and pain. Make sure to stay hydrated by drinking plenty of water. If you develop severe difficulty swallowing or any difficulty breathing then recommend going to the emergency room immediately Return to urgent care or PCP if symptoms worsen or fail to resolve.   Final Clinical Impressions(s) / UC Diagnoses   Final diagnoses:  Sore throat  Acute tonsillitis due to other specified organisms     Discharge Instructions      Strep testing done today was negative however we will send this for a culture. Symptoms and physical exam findings are consistent with acute tonsillitis.  Due to the severity of symptoms we will treat with the following:   Amoxicillin  875 mg twice daily for 10 days.  This is an antibiotic.  Take this with food.  Prednisone  40 mg (2 tablets) once daily for 5 days. Take this in the morning.  This is a steroid to help with inflammation and pain. Make sure to stay hydrated by drinking plenty of water. If you develop severe difficulty swallowing or any difficulty breathing then recommend going to the emergency room immediately Return to urgent care or PCP if symptoms worsen or fail to resolve.      ED Prescriptions     Medication Sig Dispense Auth. Provider   amoxicillin  (AMOXIL ) 875 MG tablet Take 1 tablet (875 mg total) by mouth 2 (two) times daily for 10 days. 20 tablet Damani Kelemen A, PA-C   predniSONE  (DELTASONE ) 20 MG tablet Take 2 tablets (40 mg total) by mouth daily with breakfast for 5 days. 10  tablet Teresa Almarie LABOR, [place]      PDMP not reviewed this encounter.   Teresa Almarie LABOR, PA-C 01/24/24 1658

## 2024-01-24 NOTE — ED Triage Notes (Signed)
 Pt c/o sore throat with fever and chills x's 2 days Has taken OTC meds without relief

## 2024-01-26 ENCOUNTER — Ambulatory Visit (HOSPITAL_COMMUNITY): Payer: Self-pay

## 2024-01-26 LAB — CULTURE, GROUP A STREP (THRC)

## 2024-05-19 ENCOUNTER — Ambulatory Visit (HOSPITAL_COMMUNITY)
Admission: EM | Admit: 2024-05-19 | Discharge: 2024-05-19 | Disposition: A | Discharge status: Home / Self Care | Payer: Self-pay | Attending: Family Medicine | Admitting: Family Medicine

## 2024-05-19 DIAGNOSIS — K529 Noninfective gastroenteritis and colitis, unspecified: Secondary | ICD-10-CM

## 2024-05-19 DIAGNOSIS — R1110 Vomiting, unspecified: Secondary | ICD-10-CM

## 2024-05-19 LAB — POCT URINE DIPSTICK
Bilirubin, UA: NEGATIVE
Blood, UA: NEGATIVE
Glucose, UA: NEGATIVE mg/dL
Ketones, POC UA: NEGATIVE mg/dL
Leukocytes, UA: NEGATIVE
Nitrite, UA: NEGATIVE
POC PROTEIN,UA: 30 — AB
Spec Grav, UA: 1.025 (ref 1.010–1.025)
Urobilinogen, UA: 0.2 U/dL
pH, UA: 7 (ref 5.0–8.0)

## 2024-05-19 LAB — POCT URINE PREGNANCY: Preg Test, Ur: NEGATIVE

## 2024-05-19 MED ORDER — ONDANSETRON 4 MG PO TBDP: 4.0000 mg | ORAL_TABLET | Freq: Three times a day (TID) | ORAL | 0 refills | Status: AC | PRN

## 2024-05-19 NOTE — Discharge Instructions (Signed)
Please do your best to ensure adequate fluid intake in order to avoid dehydration. If you find that you are unable to tolerate drinking fluids regularly please proceed to the Emergency Department for evaluation. ° ° °

## 2024-05-19 NOTE — ED Triage Notes (Signed)
 PT has vomiting and diarrhea that started this morning. States she feel better and would like a work note.

## 2024-05-20 ENCOUNTER — Ambulatory Visit (HOSPITAL_COMMUNITY): Payer: Self-pay

## 2024-05-20 NOTE — ED Provider Notes (Signed)
 [facility]   [id number] 05/19/24 Arrival Time: 1645  ASSESSMENT & PLAN:  1. Vomiting, unspecified vomiting type, unspecified whether nausea present   2. Gastroenteritis    Tolerating PO. Meds ordered this encounter  Medications   ondansetron  (ZOFRAN -ODT) 4 MG disintegrating tablet    Sig: Take 1 tablet (4 mg total) by mouth every 8 (eight) hours as needed for nausea or vomiting.    Dispense:  15 tablet    Refill:  0   Benign abdomen. Discussed typical duration of symptoms for suspected viral GI illness. Will do her best to ensure adequate fluid intake in order to avoid dehydration. Will proceed to the Emergency Department for evaluation if unable to tolerate PO fluids regularly.  Otherwise she will f/u with her PCP or here if not showing improvement over the next 48-72 hours.  Reviewed expectations re: course of current medical issues. Questions answered. Outlined signs and symptoms indicating need for more acute intervention. Patient verbalized understanding. After Visit Summary given.   SUBJECTIVE: History from: patient.  Adrienne Johnson is a 23 y.o. female who presents with complaint of non-bilious, non-bloody intermittent n/v with non-bloody diarrhea. Onset today. Abdominal discomfort: mild and cramping. Feeling much better now. Requests work note  Patient's last menstrual period was 05/19/2024 (approximate).  No past surgical history on file.  ROS: As per HPI.  OBJECTIVE:  Vitals:   05/19/24 1834  BP: 135/82  Pulse: 82  Resp: 18  Temp: 98.3 F (36.8 C)  TempSrc: Oral  SpO2: 98%    General appearance: alert; no distress Oropharynx: moist Lungs: clear to auscultation bilaterally; unlabored Heart: regular rate and rhythm Abdomen: soft; non-distended Back: no CVA tenderness Extremities: no edema; symmetrical with no gross deformities Skin: warm; dry Neurologic: normal gait Psychological: alert and cooperative; normal mood and  affect  Labs: Results for orders placed or performed during the hospital encounter of 05/19/24  POCT urine pregnancy   Collection Time: 05/19/24  7:56 PM  Result Value Ref Range   Preg Test, Ur Negative Negative  POC Urinalysis Dipstick   Collection Time: 05/19/24  7:56 PM  Result Value Ref Range   Color, UA yellow yellow   Clarity, UA clear clear   Glucose, UA negative negative mg/dL   Bilirubin, UA negative negative   Ketones, POC UA negative negative mg/dL   Spec Grav, UA 8.974 8.989 - 1.025   Blood, UA negative negative   pH, UA 7.0 5.0 - 8.0   POC PROTEIN,UA =30 (A) negative, trace   Urobilinogen, UA 0.2 0.2 or 1.0 E.U./dL   Nitrite, UA Negative Negative   Leukocytes, UA Negative Negative   Labs Reviewed  POCT URINE DIPSTICK - Abnormal; Notable for the following components:      Result Value   POC PROTEIN,UA =30 (*)    All other components within normal limits  POCT URINE PREGNANCY    Imaging: No results found.  Allergies  Allergen Reactions   Shellfish Allergy Anaphylaxis                                               No past medical history on file. Social History   Socioeconomic History   Marital status: Single    Spouse name: Not on file   Number of children: Not on file   Years of education: Not on file   Highest  education level: Not on file  Occupational History   Not on file  Tobacco Use   Smoking status: Never   Smokeless tobacco: Never  Vaping Use   Vaping status: Some Days  Substance and Sexual Activity   Alcohol use: Never   Drug use: Never    Types: Marijuana   Sexual activity: Not on file  Other Topics Concern   Not on file  Social History Narrative   Not on file   Social Drivers of Health   Financial Resource Strain: Not on file  Food Insecurity: Not on file  Transportation Needs: Not on file  Physical Activity: Not on file  Stress: Not on file  Social Connections: Not on file  Intimate Partner Violence: Not on file   No  family history on file.    Rolinda Rogue, MD 05/20/24 [phone]

## 2024-07-01 ENCOUNTER — Encounter (HOSPITAL_COMMUNITY): Payer: Self-pay

## 2024-07-01 ENCOUNTER — Ambulatory Visit (HOSPITAL_COMMUNITY)
Admission: RE | Admit: 2024-07-01 | Discharge: 2024-07-01 | Disposition: A | Discharge status: Home / Self Care | Payer: Self-pay | Source: Ambulatory Visit | Attending: Internal Medicine | Admitting: Internal Medicine

## 2024-07-01 VITALS — BP 120/83 | HR 83 | Temp 98.5°F | Resp 16

## 2024-07-01 DIAGNOSIS — Z3202 Encounter for pregnancy test, result negative: Secondary | ICD-10-CM

## 2024-07-01 DIAGNOSIS — L738 Other specified follicular disorders: Secondary | ICD-10-CM

## 2024-07-01 LAB — POCT URINE PREGNANCY: Preg Test, Ur: NEGATIVE

## 2024-07-01 MED ORDER — CEPHALEXIN 500 MG PO CAPS: 500.0000 mg | ORAL_CAPSULE | Freq: Three times a day (TID) | ORAL | 0 refills | Status: AC

## 2024-07-01 NOTE — ED Triage Notes (Signed)
 Pt c/o of vaginal itching (top of pubic area) x 1 week. Denies any vaginal lesions, bumps, or rash. + intermittent lower abdominal cramping. Denies any dysuria, hematuria, vaginal discharge or odor. Patient does shave, she last shaved last week. Unprotected sex last Sunday. Pt started new Vagisil pH balance wash x 1 month, stopped using about 1 week ago. Pt did start a new dial bar soap x 3 days ago. No oral contraceptives. LMP: 05/18/2024. At home urine preg 2 days ago.

## 2024-07-01 NOTE — Discharge Instructions (Signed)
 Take antibiotics as prescribed. Use warm compresses to the pubic region to reduce swelling, pain, and itching. No shaving until this has gotten better and resolved.  If you develop any new or worsening symptoms or if your symptoms do not start to improve, please return here or follow-up with your primary care provider. If your symptoms are severe, please go to the emergency room.

## 2024-07-01 NOTE — ED Provider Notes (Signed)
 " [facility]    CSN: [id number] Arrival date & time: 07/01/24  1135      History   Chief Complaint Chief Complaint  Patient presents with   Vaginal Itching    On the outside I don't know if it's from soap or what but it's Been going on for about a week - Entered by patient    HPI Adrienne Johnson is a 24 y.o. female.   Adrienne Johnson is a 24 y.o. female presenting for chief complaint of vaginal itching and itching to the suprapubic region that started approximately 1 week ago. Symptoms have improved slightly. She shaves her pubic hair frequently (every other day) using soap and water. Recently changed soaps that she washes her GU region with and wonders if this could have triggered yeast infection. Denies pus/pain to the GU region, states she has razor bumps but this is normal for her.  She denies vaginal discharge, internal vaginal itching, and vaginal odor.  No internal vaginal rashes. No fever, chills, nausea, vomiting, or urinary symptoms.  No concern for STD.     Vaginal Itching    History reviewed. No pertinent past medical history.  There are no active problems to display for this patient.   History reviewed. No pertinent surgical history.  OB History   No obstetric history on file.      Home Medications    Prior to Admission medications  Medication Sig Start Date End Date Taking? Authorizing Provider  cephALEXin  (KEFLEX ) 500 MG capsule Take 1 capsule (500 mg total) by mouth 3 (three) times daily for 7 days. 07/01/24 07/08/24 Yes StanhopeDorna HERO, FNP  ondansetron  (ZOFRAN -ODT) 4 MG disintegrating tablet Take 1 tablet (4 mg total) by mouth every 8 (eight) hours as needed for nausea or vomiting. 05/19/24   Rolinda Rogue, MD    Family History History reviewed. No pertinent family history.  Social History Social History[1]   Allergies   Shellfish allergy   Review of Systems Review of Systems Per HPI  Physical Exam Triage Vital Signs ED  Triage Vitals  Encounter Vitals Group     BP 07/01/24 1155 120/83     Girls Systolic BP Percentile --      Girls Diastolic BP Percentile --      Boys Systolic BP Percentile --      Boys Diastolic BP Percentile --      Pulse Rate 07/01/24 1155 83     Resp 07/01/24 1155 16     Temp 07/01/24 1155 98.5 F (36.9 C)     Temp Source 07/01/24 1155 Oral     SpO2 07/01/24 1155 98 %     Weight --      Height --      Head Circumference --      Peak Flow --      Pain Score 07/01/24 1153 0     Pain Loc --      Pain Education --      Exclude from Growth Chart --    No data found.  Updated Vital Signs BP 120/83 (BP Location: Left Arm)   Pulse 83   Temp 98.5 F (36.9 C) (Oral)   Resp 16   LMP 05/18/2024 (Approximate)   SpO2 98%   Visual Acuity Right Eye Distance:   Left Eye Distance:   Bilateral Distance:    Right Eye Near:   Left Eye Near:    Bilateral Near:     Physical Exam Vitals and nursing  note reviewed. Exam conducted with a chaperone present [facility] present for GU exam).  Constitutional:      Appearance: She is not ill-appearing or toxic-appearing.  HENT:     Head: Normocephalic and atraumatic.     Right Ear: Hearing and external ear normal.     Left Ear: Hearing and external ear normal.     Nose: Nose normal.     Mouth/Throat:     Lips: Pink.  Eyes:     General: Lids are normal. Vision grossly intact. Gaze aligned appropriately.     Extraocular Movements: Extraocular movements intact.     Conjunctiva/sclera: Conjunctivae normal.  Pulmonary:     Effort: Pulmonary effort is normal.  Abdominal:     General: Bowel sounds are normal.     Palpations: Abdomen is soft.     Tenderness: There is no abdominal tenderness. There is no right CVA tenderness, left CVA tenderness or guarding.  Genitourinary:    Exam position: Knee-chest position.     Pubic Area: Rash (Erythematous pustular lesions scattered to the pubic hair region) present.     Labia:        Right: No  rash, tenderness, lesion or injury.        Left: No rash, tenderness, lesion or injury.   Musculoskeletal:     Cervical back: Neck supple.  Lymphadenopathy:     Lower Body: Right inguinal adenopathy present. Left inguinal adenopathy present.  Skin:    General: Skin is warm and dry.     Capillary Refill: Capillary refill takes less than 2 seconds.     Findings: No rash.  Neurological:     General: No focal deficit present.     Mental Status: She is alert and oriented to person, place, and time. Mental status is at baseline.     Cranial Nerves: No dysarthria or facial asymmetry.  Psychiatric:        Mood and Affect: Mood normal.        Speech: Speech normal.        Behavior: Behavior normal.        Thought Content: Thought content normal.        Judgment: Judgment normal.      UC Treatments / Results  Labs (all labs ordered are listed, but only abnormal results are displayed) Labs Reviewed  POCT URINE PREGNANCY    EKG   Radiology No results found.  Procedures Procedures (including critical care time)  Medications Ordered in UC Medications - No data to display  Initial Impression / Assessment and Plan / UC Course  I have reviewed the triage vital signs and the nursing notes.  Pertinent labs & imaging results that were available during my care of the patient were reviewed by me and considered in my medical decision making (see chart for details).   1.  Bacterial folliculitis, negative pregnancy test Presentation is consistent with bacterial folliculitis.  We will treat with Keflex  3 times daily for 7 days. Warm compresses encouraged. Advised to avoid shaving the area until infection has resolved.  Discussed signs of worsening infection/return precautions, patient expresses understanding and agreement plan. Urine pregnancy test is negative.  Counseled patient on potential for adverse effects with medications prescribed/recommended today, strict ER and return-to-clinic  precautions discussed, patient verbalized understanding.    Final Clinical Impressions(s) / UC Diagnoses   Final diagnoses:  Bacterial folliculitis  Negative pregnancy test     Discharge Instructions      Take antibiotics as  prescribed. Use warm compresses to the pubic region to reduce swelling, pain, and itching. No shaving until this has gotten better and resolved.  If you develop any new or worsening symptoms or if your symptoms do not start to improve, please return here or follow-up with your primary care provider. If your symptoms are severe, please go to the emergency room.     ED Prescriptions     Medication Sig Dispense Auth. Provider   cephALEXin  (KEFLEX ) 500 MG capsule Take 1 capsule (500 mg total) by mouth 3 (three) times daily for 7 days. 21 capsule Enedelia Dorna HERO, FNP      PDMP not reviewed this encounter.     [1]  Social History Tobacco Use   Smoking status: Never   Smokeless tobacco: Never  Vaping Use   Vaping status: Some Days  Substance Use Topics   Alcohol use: Yes    Comment: liquor every other weekend   Drug use: Not Currently    Types: Marijuana     Enedelia Dorna HERO, FNP 07/01/24 1303  "
